# Patient Record
Sex: Female | Born: 1955 | Race: White | Hispanic: No | Smoking: Former smoker
Health system: Southern US, Community
[De-identification: ages and names within clinical notes are randomized; demographics above are authoritative.]

## PROBLEM LIST (undated history)

## (undated) DIAGNOSIS — I10 Essential (primary) hypertension: Secondary | ICD-10-CM

## (undated) DIAGNOSIS — I480 Paroxysmal atrial fibrillation: Secondary | ICD-10-CM

## (undated) DIAGNOSIS — I340 Nonrheumatic mitral (valve) insufficiency: Secondary | ICD-10-CM

## (undated) DIAGNOSIS — R002 Palpitations: Secondary | ICD-10-CM

## (undated) DIAGNOSIS — J019 Acute sinusitis, unspecified: Secondary | ICD-10-CM

## (undated) DIAGNOSIS — H269 Unspecified cataract: Secondary | ICD-10-CM

## (undated) DIAGNOSIS — J189 Pneumonia, unspecified organism: Secondary | ICD-10-CM

## (undated) DIAGNOSIS — Z7901 Long term (current) use of anticoagulants: Secondary | ICD-10-CM

## (undated) HISTORY — PX: TONSILLECTOMY: SUR1361

## (undated) HISTORY — DX: Paroxysmal atrial fibrillation: I48.0

## (undated) HISTORY — PX: TUBAL LIGATION: SHX77

## (undated) HISTORY — PX: HERNIA REPAIR: SHX51

## (undated) HISTORY — DX: Nonrheumatic mitral (valve) insufficiency: I34.0

## (undated) HISTORY — DX: Pneumonia, unspecified organism: J18.9

## (undated) HISTORY — PX: CRYOABLATION: SHX1415

## (undated) HISTORY — DX: Acute sinusitis, unspecified: J01.90

## (undated) HISTORY — DX: Long term (current) use of anticoagulants: Z79.01

## (undated) HISTORY — DX: Palpitations: R00.2

---

## 2008-09-01 DIAGNOSIS — H15009 Unspecified scleritis, unspecified eye: Secondary | ICD-10-CM | POA: Insufficient documentation

## 2012-03-08 DIAGNOSIS — I83893 Varicose veins of bilateral lower extremities with other complications: Secondary | ICD-10-CM | POA: Insufficient documentation

## 2014-06-29 DIAGNOSIS — M8000XD Age-related osteoporosis with current pathological fracture, unspecified site, subsequent encounter for fracture with routine healing: Secondary | ICD-10-CM | POA: Insufficient documentation

## 2016-08-21 DIAGNOSIS — H6123 Impacted cerumen, bilateral: Secondary | ICD-10-CM | POA: Insufficient documentation

## 2016-08-21 DIAGNOSIS — J329 Chronic sinusitis, unspecified: Secondary | ICD-10-CM | POA: Insufficient documentation

## 2018-06-07 ENCOUNTER — Encounter: Payer: Self-pay | Admitting: Cardiology

## 2018-06-10 ENCOUNTER — Encounter: Payer: Self-pay | Admitting: *Deleted

## 2018-06-10 ENCOUNTER — Ambulatory Visit: Payer: BLUE CROSS/BLUE SHIELD | Admitting: Cardiology

## 2018-06-10 ENCOUNTER — Encounter: Payer: Self-pay | Admitting: Cardiology

## 2018-06-10 VITALS — BP 124/72 | HR 81 | Ht 66.0 in | Wt 159.0 lb

## 2018-06-10 DIAGNOSIS — I480 Paroxysmal atrial fibrillation: Secondary | ICD-10-CM | POA: Diagnosis not present

## 2018-06-10 NOTE — Progress Notes (Signed)
Electrophysiology Office Note   Date:  06/10/2018   ID:  Deborah Ortiz, Deborah Ortiz 11/17/1955, MRN [id number]  PCP:  [name], MD  Cardiologist:   Primary Electrophysiologist:  Mahima Hottle Meredith Leeds, MD    No chief complaint on file.    History of Present Illness: Deborah Ortiz is a 62 y.o. female who is being seen today for the evaluation of atrial fibrillation at the request of Franco Nones, MD. Presenting today for electrophysiology evaluation.  She has a history of paroxysmal atrial fibrillation, mitral regurgitation.  In 08/2017, she started feeling poorly after having a nail infection.  January 2019 she was found to have atrial fibrillation and was started on Xarelto, sotalol, and had a TEE guided cardioversion.  In follow-up she had recurrent atrial fibrillation with inconsistent correlation of symptoms.  She underwent ablation and had PVI on 12/2017 with cryoablation.  Sotalol was stopped post procedure due to dizziness.    Today, she denies symptoms of chest pain, shortness of breath, orthopnea, PND, lower extremity edema, claudication, dizziness, presyncope, syncope, bleeding, or neurologic sequela. The patient is tolerating medications without difficulties.  He overall does feel well.  She unfortunately does continue to have occasional palpitations.  At this point, it does seem that her palpitations are due to atrial fibrillation.  She may want further therapy in the future with either medications or ablation.  She otherwise feels much improved with clear her mindset.  She is no longer having such a foggy feeling since her ablation.   Past Medical History:  Diagnosis Date  . Long term (current) use of anticoagulants   . Mitral valve regurgitation   . Palpitations   . Paroxysmal atrial fibrillation (HCC)   . Pneumonia    HISTORY  . Sinusitis, acute    Past Surgical History:  Procedure Laterality Date  . CRYOABLATION     heart  . HERNIA REPAIR    . TONSILLECTOMY     . TUBAL LIGATION       Current Outpatient Medications  Medication Sig Dispense Refill  . CALCIUM PO Take 1 tablet by mouth daily.    . Cholecalciferol (VITAMIN D-3) 1000 units CAPS Take 1 capsule by mouth daily.    Marland Kitchen diltiazem (CARDIZEM) 30 MG tablet TAKE 2 TABLETS BY MOUTH IN THE MORNING AND 1 TABLET BY MOUTH IN THE EVENING AS NEEDED.    Marland Kitchen Glucosamine HCl (GLUCOSAMINE PO) Take 1 capsule by mouth daily.    . Multiple Vitamin (MULTIVITAMIN) capsule Take 1 capsule by mouth daily.    . Omega-3 Fatty Acids (FISH OIL PO) Take 1 capsule by mouth daily.     . Probiotic Product (PROBIOTIC PO) Take 1 tablet by mouth daily.    . rivaroxaban (XARELTO) 20 MG TABS tablet Take 20 mg by mouth daily with supper.    . sotalol (BETAPACE) 80 MG tablet Take 40 mg by mouth 2 (two) times daily as needed.      No current facility-administered medications for this visit.     Allergies:   Patient has no known allergies.   Social History:  The patient  reports that she has never smoked. She has never used smokeless tobacco. She reports that she drinks alcohol. She reports that she does not use drugs.   Family History:  The patient's family history includes CVA in her mother; Cancer in her father and mother; Diabetes in her mother; Heart disease in her mother; Stroke in her mother.    ROS:  Please see the history of present illness.   Otherwise, review of systems is positive for palpitations, visual changes, easy bruising.   All other systems are reviewed and negative.    PHYSICAL EXAM: VS:  BP 124/72   Pulse 81   Ht 5\' 6"  (1.676 m)   Wt 159 lb (72.1 kg)   BMI 25.66 kg/m  , BMI Body mass index is 25.66 kg/m. GEN: Well nourished, well developed, in no acute distress  HEENT: normal  Neck: no JVD, carotid bruits, or masses Cardiac: RRR; no murmurs, rubs, or gallops,no edema  Respiratory:  clear to auscultation bilaterally, normal work of breathing GI: soft, nontender, nondistended, + BS MS: no  deformity or atrophy  Skin: warm and dry Neuro:  Strength and sensation are intact Psych: euthymic mood, full affect  EKG:  EKG is ordered today. Personal review of the ekg ordered shows sinus rhythm, rightward axis, rate 81  Recent Labs: No results found for requested labs within last 8760 hours.    Lipid Panel  No results found for: CHOL, TRIG, HDL, CHOLHDL, VLDL, LDLCALC, LDLDIRECT   Wt Readings from Last 3 Encounters:  06/10/18 159 lb (72.1 kg)      Other studies Reviewed: Additional studies/ records that were reviewed today include: TTE 07/05/2018 Review of the above records today demonstrates:  Ejection fraction 55 to 60% Normal right ventricular size and function Mild left atrial dilation No significant valvular heart disease  Cardiac CT: LAD eccentric calcified plaque less than 50% Circumflex no significant stenosis RCA motion artifact throughout precludes accurate assessment Left main no significant narrowing   ASSESSMENT AND PLAN:  1.  Physical atrial fibrillation: Currently on Xarelto.  Status post cryoablation 12/2017.  She currently has continued palpitations, but her symptoms of atrial fibrillation are much improved.  She may be interested in further therapy in the future, but at this point, we Maddoxx Burkitt hold off.  This patients CHA2DS2-VASc Score and unadjusted Ischemic Stroke Rate (% per year) is equal to 2.2 % stroke rate/year from a score of 2  Above score calculated as 1 point each if present [CHF, HTN, DM, Vascular=MI/PAD/Aortic Plaque, Age if 65-74, or Female] Above score calculated as 2 points each if present [Age > 75, or Stroke/TIA/TE]  Current medicines are reviewed at length with the patient today.   The patient does not have concerns regarding her medicines.  The following changes were made today:  none  Labs/ tests ordered today include:  Orders Placed This Encounter  Procedures  . EKG 12-Lead     Disposition:   FU with Vishaal Strollo 3  months  Signed, Armoni Kludt Meredith Leeds, MD  06/10/2018 3:16 PM     [facility] [address] [place] [address] [phone] (office) [phone] (fax)

## 2018-06-10 NOTE — Patient Instructions (Signed)
Medication Instructions:  Your physician recommends that you continue on your current medications as directed. Please refer to the Current Medication list given to you today.  * If you need a refill on your cardiac medications before your next appointment, please call your pharmacy.   Labwork: None ordered  Testing/Procedures: None ordered  Follow-Up: Your physician recommends that you schedule a follow-up appointment in: 3 months with Dr. Camnitz.  *Please note that any paperwork needing to be filled out by the provider will need to be addressed at the front desk prior to seeing the provider. Please note that any FMLA, disability or other documents regarding health condition is subject to a $25.00 charge that must be received prior to completion of paperwork in the form of a money order or check.  Thank you for choosing [facility]!!   Jeanita Carneiro, RN [phone]       

## 2018-06-10 NOTE — Addendum Note (Signed)
Addended by: Stanton Kidney on: 06/10/2018 03:48 PM   Modules accepted: Orders

## 2018-06-11 ENCOUNTER — Institutional Professional Consult (permissible substitution): Payer: Self-pay | Admitting: Internal Medicine

## 2018-09-13 ENCOUNTER — Ambulatory Visit: Payer: BLUE CROSS/BLUE SHIELD | Admitting: Cardiology

## 2018-10-15 ENCOUNTER — Ambulatory Visit: Payer: BLUE CROSS/BLUE SHIELD | Admitting: Cardiology

## 2018-10-15 ENCOUNTER — Encounter: Payer: Self-pay | Admitting: Cardiology

## 2018-10-15 VITALS — BP 110/64 | HR 91 | Ht 66.0 in | Wt 165.0 lb

## 2018-10-15 DIAGNOSIS — Z79899 Other long term (current) drug therapy: Secondary | ICD-10-CM

## 2018-10-15 DIAGNOSIS — I480 Paroxysmal atrial fibrillation: Secondary | ICD-10-CM | POA: Diagnosis not present

## 2018-10-15 LAB — BASIC METABOLIC PANEL
BUN/Creatinine Ratio: 18 (ref 12–28)
BUN: 15 mg/dL (ref 8–27)
CALCIUM: 10.5 mg/dL — AB (ref 8.7–10.3)
CO2: 23 mmol/L (ref 20–29)
Chloride: 102 mmol/L (ref 96–106)
Creatinine, Ser: 0.84 mg/dL (ref 0.57–1.00)
GFR calc Af Amer: 85 mL/min/{1.73_m2} (ref >59)
GFR calc non Af Amer: 74 mL/min/{1.73_m2} (ref >59)
GLUCOSE: 90 mg/dL (ref 65–99)
POTASSIUM: 4.6 mmol/L (ref 3.5–5.2)
Sodium: 140 mmol/L (ref 134–144)

## 2018-10-15 LAB — CBC
Hematocrit: 39.7 % (ref 34.0–46.6)
Hemoglobin: 13.6 g/dL (ref 11.1–15.9)
MCH: 31.1 pg (ref 26.6–33.0)
MCHC: 34.3 g/dL (ref 31.5–35.7)
MCV: 91 fL (ref 79–97)
PLATELETS: 221 10*3/uL (ref 150–450)
RBC: 4.37 x10E6/uL (ref 3.77–5.28)
RDW: 12.4 % (ref 11.7–15.4)
WBC: 6.8 10*3/uL (ref 3.4–10.8)

## 2018-10-15 NOTE — Patient Instructions (Signed)
Medication Instructions:  Your physician recommends that you continue on your current medications as directed. Please refer to the Current Medication list given to you today.  * If you need a refill on your cardiac medications before your next appointment, please call your pharmacy.   Labwork: Today: BMP & CBC *We will only notify you of abnormal results, otherwise continue current treatment plan.  Testing/Procedures: None ordered  Follow-Up: Your physician wants you to follow-up in: 6 months with Dr. Curt Bears.  You will receive a reminder letter in the mail two months in advance. If you don't receive a letter, please call our office to schedule the follow-up appointment.   Thank you for choosing [facility]!!   Trinidad Curet, RN [phone]

## 2018-10-15 NOTE — Progress Notes (Signed)
Electrophysiology Office Note   Date:  10/15/2018   ID:  Deborah Ortiz, [place] December 01, 1955, MRN [id number]  PCP:  [name], MD  Cardiologist:   Primary Electrophysiologist:  Lauren Aguayo Meredith Leeds, MD    No chief complaint on file.    History of Present Illness: Vicci Reder is a 63 y.o. female who is being seen today for the evaluation of atrial fibrillation at the request of [name], MD. Presenting today for electrophysiology evaluation.  She has a history of paroxysmal atrial fibrillation, mitral regurgitation.  In 08/2017, she started feeling poorly after having a nail infection.  January 2019 she was found to have atrial fibrillation and was started on Xarelto, sotalol, and had a TEE guided cardioversion.  In follow-up she had recurrent atrial fibrillation with inconsistent correlation of symptoms.  She underwent ablation and had PVI on 12/2017 with cryoablation.  Sotalol was stopped post procedure due to dizziness.  Today, denies symptoms of palpitations, chest pain, shortness of breath, orthopnea, PND, lower extremity edema, claudication, dizziness, presyncope, syncope, bleeding, or neurologic sequela. The patient is tolerating medications without difficulties.  She is currently feeling well.  She has not had any prolonged episodes of atrial fibrillation since last being seen.  She is tolerating her sotalol without issue.   Past Medical History:  Diagnosis Date  . Long term (current) use of anticoagulants   . Mitral valve regurgitation   . Palpitations   . Paroxysmal atrial fibrillation (HCC)   . Pneumonia    HISTORY  . Sinusitis, acute    Past Surgical History:  Procedure Laterality Date  . CRYOABLATION     heart  . HERNIA REPAIR    . TONSILLECTOMY    . TUBAL LIGATION       Current Outpatient Medications  Medication Sig Dispense Refill  . BIOTIN PO Take by mouth daily.    Marland Kitchen CALCIUM PO Take 1 tablet by mouth daily.    . Cholecalciferol (VITAMIN  D-3) 1000 units CAPS Take 1 capsule by mouth daily.    . COLLAGEN PO Take by mouth daily.    . Glucosamine HCl (GLUCOSAMINE PO) Take 1 capsule by mouth daily.    . Multiple Vitamin (MULTIVITAMIN) capsule Take 1 capsule by mouth daily.    . Omega-3 Fatty Acids (FISH OIL PO) Take 1 capsule by mouth daily.     . Probiotic Product (PROBIOTIC PO) Take 1 tablet by mouth daily.    . rivaroxaban (XARELTO) 20 MG TABS tablet Take 20 mg by mouth daily with supper.     No current facility-administered medications for this visit.     Allergies:   Patient has no known allergies.   Social History:  The patient  reports that she has never smoked. She has never used smokeless tobacco. She reports current alcohol use. She reports that she does not use drugs.   Family History:  The patient's family history includes CVA in her mother; Cancer in her father and mother; Diabetes in her mother; Heart disease in her mother; Stroke in her mother.    ROS:  Please see the history of present illness.   Otherwise, review of systems is positive for none.   All other systems are reviewed and negative.   PHYSICAL EXAM: VS:  BP 110/64   Pulse 91   Ht 5\' 6"  (1.676 m)   Wt 165 lb (74.8 kg)   SpO2 97%   BMI 26.63 kg/m  , BMI Body mass index is 26.63  kg/m. GEN: Well nourished, well developed, in no acute distress  HEENT: normal  Neck: no JVD, carotid bruits, or masses Cardiac: RRR; no murmurs, rubs, or gallops,no edema  Respiratory:  clear to auscultation bilaterally, normal work of breathing GI: soft, nontender, nondistended, + BS MS: no deformity or atrophy  Skin: warm and dry Neuro:  Strength and sensation are intact Psych: euthymic mood, full affect  EKG:  EKG is not ordered today. Personal review of the ekg ordered 06/10/18 shows SR, RAD, rate 81   Recent Labs: No results found for requested labs within last 8760 hours.    Lipid Panel  No results found for: CHOL, TRIG, HDL, CHOLHDL, VLDL, LDLCALC,  LDLDIRECT   Wt Readings from Last 3 Encounters:  10/15/18 165 lb (74.8 kg)  06/10/18 159 lb (72.1 kg)      Other studies Reviewed: Additional studies/ records that were reviewed today include: TTE 07/05/2018 Review of the above records today demonstrates:  Ejection fraction 55 to 60% Normal right ventricular size and function Mild left atrial dilation No significant valvular heart disease  Cardiac CT: LAD eccentric calcified plaque less than 50% Circumflex no significant stenosis RCA motion artifact throughout precludes accurate assessment Left main no significant narrowing   ASSESSMENT AND PLAN:  1.  Paroxysmal atrial fibrillation: Currently on Xarelto and sotalol.  Status post cryoablation 12/2017.  She did continue to have palpitations but she has felt much improved over the last few months.  She continues to have short bursts, but is pleased with her therapy.  No changes.    This patients CHA2DS2-VASc Score and unadjusted Ischemic Stroke Rate (% per year) is equal to 2.2 % stroke rate/year from a score of 2  Above score calculated as 1 point each if present [CHF, HTN, DM, Vascular=MI/PAD/Aortic Plaque, Age if 65-74, or Female] Above score calculated as 2 points each if present [Age > 75, or Stroke/TIA/TE]   Current medicines are reviewed at length with the patient today.   The patient does not have concerns regarding her medicines.  The following changes were made today: None  Labs/ tests ordered today include:  Orders Placed This Encounter  Procedures  . Basic metabolic panel  . CBC     Disposition:   FU with Caidon Foti 6 months  Signed, Esiah Bazinet Meredith Leeds, MD  10/15/2018 12:24 PM     [facility] [address] [place] [address] [phone] (office) [phone] (fax)

## 2018-10-21 ENCOUNTER — Other Ambulatory Visit: Payer: Self-pay | Admitting: Internal Medicine

## 2018-10-21 DIAGNOSIS — Z1231 Encounter for screening mammogram for malignant neoplasm of breast: Secondary | ICD-10-CM

## 2018-11-29 ENCOUNTER — Telehealth: Payer: Self-pay | Admitting: Cardiology

## 2018-11-29 NOTE — Telephone Encounter (Signed)
Pt reports that she is "cleaning out her cabinets" and found some "immodium and pepto bismol".  She is asking if she can take either.  States she heard that she should avoid imodium. Informed pt to avoid imodium but she could take pepto if needed. Pt agreeable to plan and thanks me for calling back.

## 2018-11-29 NOTE — Telephone Encounter (Signed)
New Message:     Pt says she has a question about over the counter medicine please.

## 2018-12-25 ENCOUNTER — Other Ambulatory Visit: Payer: Self-pay

## 2018-12-25 MED ORDER — RIVAROXABAN 20 MG PO TABS: 20.0000 mg | ORAL_TABLET | Freq: Every day | ORAL | 5 refills | Status: DC

## 2018-12-25 NOTE — Telephone Encounter (Signed)
Age 63, weight 74.8kg, SCr 0.84 10/15/18. CrCl 14mL/min. Last OV January 2020, anticoag indication - afib.

## 2019-03-14 ENCOUNTER — Telehealth: Payer: Self-pay | Admitting: Cardiology

## 2019-03-14 NOTE — Telephone Encounter (Signed)
New Message    Pt is calling wondering if Deborah Ortiz can call her and give her recommendations on blood pressure cuffs    Please call

## 2019-03-17 NOTE — Telephone Encounter (Signed)
Pt aware I will discuss recommendation w/ HTN clinic and call her back this week. Pt agreeable and voices this is not urgent.

## 2019-03-18 NOTE — Telephone Encounter (Signed)
Reviewed w/ HTN clinic/pharmD.  Advised pt recommend Omron BP cuff, not a wrist one. Patient verbalized understanding and agreeable to plan. She appreciates me looking into this for her.

## 2019-03-20 ENCOUNTER — Ambulatory Visit
Admission: RE | Admit: 2019-03-20 | Discharge: 2019-03-20 | Disposition: A | Discharge status: Home / Self Care | Payer: Medicare Other | Source: Ambulatory Visit | Attending: Internal Medicine | Admitting: Internal Medicine

## 2019-03-20 ENCOUNTER — Other Ambulatory Visit: Payer: Self-pay

## 2019-03-20 DIAGNOSIS — Z1231 Encounter for screening mammogram for malignant neoplasm of breast: Secondary | ICD-10-CM

## 2019-04-09 ENCOUNTER — Telehealth: Payer: Self-pay | Admitting: Cardiology

## 2019-04-09 NOTE — Telephone Encounter (Signed)
New message    Spoke w/ pt and RS appt from 07.17.20 to 07.13.20. Pt will do video visit with Dr. Curt Bears, phone number is listed in appt notes.       Virtual Visit Pre-Appointment Phone Call  "(Name), I am calling you today to discuss your upcoming appointment. We are currently trying to limit exposure to the virus that causes COVID-19 by seeing patients at home rather than in the office."  1. "What is the BEST phone number to call the day of the visit?" - include this in appointment notes  2. Do you have or have access to (through a family member/friend) a smartphone with video capability that we can use for your visit?" a. If yes - list this number in appt notes as cell (if different from BEST phone #) and list the appointment type as a VIDEO visit in appointment notes b. If no - list the appointment type as a PHONE visit in appointment notes  3. Confirm consent - "In the setting of the current Covid19 crisis, you are scheduled for a (phone or video) visit with your provider on (date) at (time).  Just as we do with many in-office visits, in order for you to participate in this visit, we must obtain consent.  If you'd like, I can send this to your mychart (if signed up) or email for you to review.  Otherwise, I can obtain your verbal consent now.  All virtual visits are billed to your insurance company just like a normal visit would be.  By agreeing to a virtual visit, we'd like you to understand that the technology does not allow for your provider to perform an examination, and thus may limit your provider's ability to fully assess your condition. If your provider identifies any concerns that need to be evaluated in person, we will make arrangements to do so.  Finally, though the technology is pretty good, we cannot assure that it will always work on either your or our end, and in the setting of a video visit, we may have to convert it to a phone-only visit.  In either situation, we cannot  ensure that we have a secure connection.  Are you willing to proceed?" STAFF: Did the patient verbally acknowledge consent to telehealth visit? Document YES/NO here: YES  4. Advise patient to be prepared - "Two hours prior to your appointment, go ahead and check your blood pressure, pulse, oxygen saturation, and your weight (if you have the equipment to check those) and write them all down. When your visit starts, your provider will ask you for this information. If you have an Apple Watch or Kardia device, please plan to have heart rate information ready on the day of your appointment. Please have a pen and paper handy nearby the day of the visit as well."  5. Give patient instructions for MyChart download to smartphone OR [email or web address] as below if video visit (depending on what platform provider is using)  6. Inform patient they will receive a phone call 15 minutes prior to their appointment time (may be from unknown caller ID) so they should be prepared to answer    TELEPHONE CALL NOTE  Tasia Liz Limb has been deemed a candidate for a follow-up tele-health visit to limit community exposure during the Covid-19 pandemic. I spoke with the patient via phone to ensure availability of phone/video source, confirm preferred email & phone number, and discuss instructions and expectations.  I reminded Deborah Ortiz to be prepared  with any vital sign and/or heart rhythm information that could potentially be obtained via home monitoring, at the time of her visit. I reminded Krista Godsil Mcbrayer to expect a phone call prior to her visit.  [place] Harriette Ohara 04/09/2019 9:45 AM   INSTRUCTIONS FOR DOWNLOADING THE MYCHART APP TO SMARTPHONE  - The patient must first make sure to have activated MyChart and know their login information - If Apple, go to [employer] and type in MyChart in the search bar and download the app. If Android, ask patient to go to [employer] and type in [place] in the  search bar and download the app. The app is free but as with any other app downloads, their phone may require them to verify saved payment information or Apple/Android password.  - The patient will need to then log into the app with their MyChart username and password, and select [facility] as their healthcare provider to link the account. When it is time for your visit, go to the MyChart app, find appointments, and click Begin Video Visit. Be sure to Select Allow for your device to access the Microphone and Camera for your visit. You will then be connected, and your provider will be with you shortly.  **If they have any issues connecting, or need assistance please contact MyChart service desk [phone])**  **If using a computer, in order to ensure the best quality for their visit they will need to use either of the following Internet Browsers: [employer], or Google Chrome**  IF USING DOXIMITY or DOXY.ME - The patient will receive a link just prior to their visit by text.     FULL LENGTH CONSENT FOR TELE-HEALTH VISIT   I hereby voluntarily request, consent and authorize [facility] and its employed or contracted physicians, physician assistants, nurse practitioners or other licensed health care professionals (the Practitioner), to provide me with telemedicine health care services (the Services") as deemed necessary by the treating Practitioner. I acknowledge and consent to receive the Services by the Practitioner via telemedicine. I understand that the telemedicine visit will involve communicating with the Practitioner through live audiovisual communication technology and the disclosure of certain medical information by electronic transmission. I acknowledge that I have been given the opportunity to request an in-person assessment or other available alternative prior to the telemedicine visit and am voluntarily participating in the telemedicine visit.  I understand that I  have the right to withhold or withdraw my consent to the use of telemedicine in the course of my care at any time, without affecting my right to future care or treatment, and that the Practitioner or I may terminate the telemedicine visit at any time. I understand that I have the right to inspect all information obtained and/or recorded in the course of the telemedicine visit and may receive copies of available information for a reasonable fee.  I understand that some of the potential risks of receiving the Services via telemedicine include:   Delay or interruption in medical evaluation due to technological equipment failure or disruption;  Information transmitted may not be sufficient (e.g. poor resolution of images) to allow for appropriate medical decision making by the Practitioner; and/or   In rare instances, security protocols could fail, causing a breach of personal health information.  Furthermore, I acknowledge that it is my responsibility to provide information about my medical history, conditions and care that is complete and accurate to the best of my ability. I acknowledge that Practitioner's advice, recommendations,  and/or decision may be based on factors not within their control, such as incomplete or inaccurate data provided by me or distortions of diagnostic images or specimens that may result from electronic transmissions. I understand that the practice of medicine is not an exact science and that Practitioner makes no warranties or guarantees regarding treatment outcomes. I acknowledge that I will receive a copy of this consent concurrently upon execution via email to the email address I last provided but may also request a printed copy by calling the office of [facility].    I understand that my insurance will be billed for this visit.   I have read or had this consent read to me.  I understand the contents of this consent, which adequately explains the benefits and risks of the  Services being provided via telemedicine.   I have been provided ample opportunity to ask questions regarding this consent and the Services and have had my questions answered to my satisfaction.  I give my informed consent for the services to be provided through the use of telemedicine in my medical care  By participating in this telemedicine visit I agree to the above.

## 2019-04-14 ENCOUNTER — Telehealth (INDEPENDENT_AMBULATORY_CARE_PROVIDER_SITE_OTHER): Payer: Medicare Other | Admitting: Cardiology

## 2019-04-14 DIAGNOSIS — I480 Paroxysmal atrial fibrillation: Secondary | ICD-10-CM

## 2019-04-14 NOTE — Progress Notes (Signed)
Electrophysiology TeleHealth Note   Due to national recommendations of social distancing due to COVID 19, an audio/video telehealth visit is felt to be most appropriate for this patient at this time.  See Epic message for the patient's consent to telehealth for [facility].   Date:  04/14/2019   ID:  Sheralyn, Pinegar 1956-03-31, MRN [id number]  Location: patient's home  Provider location: [address] [place]  Evaluation Performed: Follow-up visit  PCP:  [name], MD  Cardiologist:  No primary care provider on file.  Electrophysiologist:  Dr Curt Bears  Chief Complaint:  AF  History of Present Illness:    Deborah Ortiz is a 63 y.o. female who presents via audio/video conferencing for a telehealth visit today.  Since last being seen in our clinic, the patient reports doing very well.  Today, she denies symptoms of palpitations, chest pain, shortness of breath,  lower extremity edema, dizziness, presyncope, or syncope.  The patient is otherwise without complaint today.  The patient denies symptoms of fevers, chills, cough, or new SOB worrisome for COVID 19.  History of paroxysmal AF, mitral regurgitation. S/p PVI with cryoablation 12/2017.  Today, denies symptoms of palpitations, chest pain, shortness of breath, orthopnea, PND, lower extremity edema, claudication, dizziness, presyncope, syncope, bleeding, or neurologic sequela. The patient is tolerating medications without difficulties.  She continues to have intermittent palpitations.  Her palpitations have not changed since last being seen.  She feels a slight pressure in the center of her chest when this occurs.  She also has some weakness and fatigue.  She has been self isolating since the coronavirus pandemic, trying to avoid people and has not gone outside very much.  Past Medical History:  Diagnosis Date  . Long term (current) use of anticoagulants   . Mitral valve regurgitation   .  Palpitations   . Paroxysmal atrial fibrillation (HCC)   . Pneumonia    HISTORY  . Sinusitis, acute     Past Surgical History:  Procedure Laterality Date  . CRYOABLATION     heart  . HERNIA REPAIR    . TONSILLECTOMY    . TUBAL LIGATION      Current Outpatient Medications  Medication Sig Dispense Refill  . BIOTIN PO Take by mouth daily.    Marland Kitchen CALCIUM PO Take 1 tablet by mouth daily.    . Cholecalciferol (VITAMIN D-3) 1000 units CAPS Take 1 capsule by mouth daily.    . COLLAGEN PO Take by mouth daily.    . Glucosamine HCl (GLUCOSAMINE PO) Take 1 capsule by mouth daily.    . Multiple Vitamin (MULTIVITAMIN) capsule Take 1 capsule by mouth daily.    . Omega-3 Fatty Acids (FISH OIL PO) Take 1 capsule by mouth daily.     . Probiotic Product (PROBIOTIC PO) Take 1 tablet by mouth daily.    . rivaroxaban (XARELTO) 20 MG TABS tablet Take 1 tablet (20 mg total) by mouth daily with supper. 30 tablet 5   No current facility-administered medications for this visit.     Allergies:   Patient has no known allergies.   Social History:  The patient  reports that she has never smoked. She has never used smokeless tobacco. She reports current alcohol use. She reports that she does not use drugs.   Family History:  The patient's  family history includes CVA in her mother; Cancer in her father and mother; Diabetes in her mother; Heart disease in her mother;  Stroke in her mother.   ROS:  Please see the history of present illness.   All other systems are personally reviewed and negative.    Exam:    Vital Signs:  Pulse 77   Wt 148 lb (67.1 kg)   SpO2 98%   BMI 23.89 kg/m   no acute distress, no shortness of breath.  Labs/Other Tests and Data Reviewed:    Recent Labs: 10/15/2018: BUN 15; Creatinine, Ser 0.84; Hemoglobin 13.6; Platelets 221; Potassium 4.6; Sodium 140   Wt Readings from Last 3 Encounters:  04/14/19 148 lb (67.1 kg)  10/15/18 165 lb (74.8 kg)  06/10/18 159 lb (72.1 kg)      Other studies personally reviewed: Additional studies/ records that were reviewed today include: ECG 06/10/18 personally reviewed  Review of the above records today demonstrates:  SR, rate 81   ASSESSMENT & PLAN:    1.  Paroxysmal atrial fibrillation: Currently on Xarelto.  Status post cryoablation 01/19/2018.  She unfortunately has continued to have short episodes of atrial fibrillation, but she does not feel that any further intervention is needed at this time.  No changes.  This patients CHA2DS2-VASc Score and unadjusted Ischemic Stroke Rate (% per year) is equal to 2.2 % stroke rate/year from a score of 2  Above score calculated as 1 point each if present [CHF, HTN, DM, Vascular=MI/PAD/Aortic Plaque, Age if 65-74, or Female] Above score calculated as 2 points each if present [Age > 75, or Stroke/TIA/TE]  COVID 19 screen The patient denies symptoms of COVID 19 at this time.  The importance of social distancing was discussed today.  Follow-up: 6 months  Current medicines are reviewed at length with the patient today.   The patient does not have concerns regarding her medicines.  The following changes were made today:  none  Labs/ tests ordered today include:  No orders of the defined types were placed in this encounter.    Patient Risk:  after full review of this patients clinical status, I feel that they are at moderate risk at this time.  Today, I have spent 15 minutes with the patient with telehealth technology discussing atrial fibrillation, coronavirus.    Signed, Gavriella Hearst Meredith Leeds, MD  04/14/2019 3:31 PM     [place] [address] [place] [address] [phone] (office) [phone] (fax)

## 2019-04-18 ENCOUNTER — Telehealth: Payer: BLUE CROSS/BLUE SHIELD | Admitting: Cardiology

## 2019-04-29 ENCOUNTER — Other Ambulatory Visit (HOSPITAL_COMMUNITY)
Admission: RE | Admit: 2019-04-29 | Discharge: 2019-04-29 | Disposition: A | Discharge status: Home / Self Care | Payer: Medicare Other | Source: Ambulatory Visit | Attending: Obstetrics and Gynecology | Admitting: Obstetrics and Gynecology

## 2019-04-29 ENCOUNTER — Other Ambulatory Visit: Payer: Self-pay | Admitting: Obstetrics and Gynecology

## 2019-04-29 DIAGNOSIS — Z01419 Encounter for gynecological examination (general) (routine) without abnormal findings: Secondary | ICD-10-CM | POA: Diagnosis present

## 2019-05-13 LAB — CYTOLOGY - PAP
Diagnosis: NEGATIVE
HPV: NOT DETECTED

## 2019-05-23 DIAGNOSIS — H9203 Otalgia, bilateral: Secondary | ICD-10-CM | POA: Insufficient documentation

## 2019-05-23 DIAGNOSIS — M2669 Other specified disorders of temporomandibular joint: Secondary | ICD-10-CM | POA: Insufficient documentation

## 2019-07-19 ENCOUNTER — Other Ambulatory Visit: Payer: Self-pay | Admitting: Cardiology

## 2019-07-21 NOTE — Telephone Encounter (Signed)
Xarelto 20mg  refill request received. Pt is 63 years old, weight-67.1kg, Crea-0.84 on 10/15/2018, last seen by Dr. Curt Bears on 04/14/2019 via Telemedicine, Diagnosis-Afib, CrCl-70.70ml/min; Dose is appropriate based on dosing criteria. Will send in refill to requested pharmacy.

## 2019-07-24 ENCOUNTER — Telehealth: Payer: Self-pay | Admitting: Cardiology

## 2019-07-24 NOTE — Telephone Encounter (Signed)
Advised pt that she may take Claritin while taking Xarelto. Patient verbalized understanding and agreeable to plan.

## 2019-07-24 NOTE — Telephone Encounter (Signed)
New Message:      Pt wants to know if she can take Claritin with her other medicine she is on?

## 2019-10-13 ENCOUNTER — Other Ambulatory Visit: Payer: Self-pay

## 2019-10-13 ENCOUNTER — Ambulatory Visit (INDEPENDENT_AMBULATORY_CARE_PROVIDER_SITE_OTHER): Payer: Medicare Other | Admitting: Cardiology

## 2019-10-13 ENCOUNTER — Encounter: Payer: Self-pay | Admitting: Cardiology

## 2019-10-13 VITALS — BP 118/68 | HR 71 | Ht 66.0 in | Wt 157.4 lb

## 2019-10-13 DIAGNOSIS — I480 Paroxysmal atrial fibrillation: Secondary | ICD-10-CM | POA: Diagnosis not present

## 2019-10-13 NOTE — Patient Instructions (Signed)
Medication Instructions:  Your physician recommends that you continue on your current medications as directed. Please refer to the Current Medication list given to you today.  * If you need a refill on your cardiac medications before your next appointment, please call your pharmacy.   Labwork: None ordered If you have labs (blood work) drawn today and your tests are completely normal, you will receive your results only by:  [facility] (if you have MyChart) OR  A paper copy in the mail If you have any lab test that is abnormal or we need to change your treatment, we will call you to review the results.  Testing/Procedures: None ordered  Follow-Up: At [facility], you and your health needs are our priority.  As part of our continuing mission to provide you with exceptional heart care, we have created designated Provider Care Teams.  These Care Teams include your primary Cardiologist (physician) and Advanced Practice Providers (APPs -  Physician Assistants and Nurse Practitioners) who all work together to provide you with the care you need, when you need it.  You will need a follow up appointment in 6 months.  Please call our office 2 months in advance to schedule this appointment.  You may see Dr Curt Bears or one of the following Advanced Practice Providers on your designated Care Team:    Chanetta Marshall, NP  Tommye Standard, PA-C  Oda Kilts, [place]   Thank you for choosing [facility]!!   Trinidad Curet, RN [phone]  Any Other Special Instructions Will Be Listed Below (If Applicable).

## 2019-10-13 NOTE — Progress Notes (Signed)
Electrophysiology Office Note   Date:  10/13/2019   ID:  Cailey, Stauffer 1956/08/10, MRN [id number]  PCP:  [name], MD  Cardiologist:   Primary Electrophysiologist:  Joziyah Roblero Meredith Leeds, MD    No chief complaint on file.    History of Present Illness: Deborah Ortiz is a 64 y.o. female who is being seen today for the evaluation of atrial fibrillation at the request of [name], MD. Presenting today for electrophysiology evaluation.  She has a history of paroxysmal atrial fibrillation, mitral regurgitation.  In 08/2017, she started feeling poorly after having a nail infection.  January 2019 she was found to have atrial fibrillation and was started on Xarelto, sotalol, and had a TEE guided cardioversion.  In follow-up she had recurrent atrial fibrillation with inconsistent correlation of symptoms.  She underwent ablation and had PVI on 12/2017 with cryoablation.  Sotalol was stopped post procedure due to dizziness.   Today, denies symptoms of palpitations, chest pain, shortness of breath, orthopnea, PND, lower extremity edema, claudication, dizziness, presyncope, syncope, bleeding, or neurologic sequela. The patient is tolerating medications without difficulties.  She has noted no further episodes of atrial fibrillation.  She has stayed in normal rhythm.  She does feel some thumping.  This occurs mainly when she is stressed.  Nothing makes her mildly short of breath as well.  She says that when she is less stressed, this greatly improves her symptoms.  Past Medical History:  Diagnosis Date  . Long term (current) use of anticoagulants   . Mitral valve regurgitation   . Palpitations   . Paroxysmal atrial fibrillation (HCC)   . Pneumonia    HISTORY  . Sinusitis, acute    Past Surgical History:  Procedure Laterality Date  . CRYOABLATION     heart  . HERNIA REPAIR    . TONSILLECTOMY    . TUBAL LIGATION       Current Outpatient Medications  Medication Sig  Dispense Refill  . BIOTIN PO Take by mouth daily.    Marland Kitchen CALCIUM PO Take 1 tablet by mouth daily.    . Cholecalciferol (VITAMIN D-3) 1000 units CAPS Take 1 capsule by mouth daily.    . COLLAGEN PO Take by mouth daily.    . Glucosamine HCl (GLUCOSAMINE PO) Take 1 capsule by mouth daily.    . Multiple Vitamin (MULTIVITAMIN) capsule Take 1 capsule by mouth daily.    . Omega-3 Fatty Acids (FISH OIL PO) Take 1 capsule by mouth daily.     . Probiotic Product (PROBIOTIC PO) Take 1 tablet by mouth daily.    Alveda Reasons 20 MG TABS tablet TAKE 1 TABLET (20 MG TOTAL) BY MOUTH DAILY WITH SUPPER. 30 tablet 5   No current facility-administered medications for this visit.    Allergies:   Doxycycline and Penicillins   Social History:  The patient  reports that she quit smoking about 11 years ago. She has a 30.00 pack-year smoking history. She has never used smokeless tobacco. She reports current alcohol use. She reports that she does not use drugs.   Family History:  The patient's family history includes CVA in her mother; Cancer in her father and mother; Diabetes in her mother; Heart disease in her mother; Stroke in her mother.    ROS:  Please see the history of present illness.   Otherwise, review of systems is positive for none.   All other systems are reviewed and negative.   PHYSICAL EXAM: VS:  BP  118/68   Pulse 71   Ht 5\' 6"  (1.676 m)   Wt 157 lb 6.4 oz (71.4 kg)   SpO2 97%   BMI 25.41 kg/m  , BMI Body mass index is 25.41 kg/m. GEN: Well nourished, well developed, in no acute distress  HEENT: normal  Neck: no JVD, carotid bruits, or masses Cardiac: RRR; no murmurs, rubs, or gallops,no edema  Respiratory:  clear to auscultation bilaterally, normal work of breathing GI: soft, nontender, nondistended, + BS MS: no deformity or atrophy  Skin: warm and dry Neuro:  Strength and sensation are intact Psych: euthymic mood, full affect  EKG:  EKG is ordered today. Personal review of the ekg  ordered shows this rhythm, rate 71, PVCs  Recent Labs: 10/15/2018: BUN 15; Creatinine, Ser 0.84; Hemoglobin 13.6; Platelets 221; Potassium 4.6; Sodium 140    Lipid Panel  No results found for: CHOL, TRIG, HDL, CHOLHDL, VLDL, LDLCALC, LDLDIRECT   Wt Readings from Last 3 Encounters:  10/13/19 157 lb 6.4 oz (71.4 kg)  04/14/19 148 lb (67.1 kg)  10/15/18 165 lb (74.8 kg)      Other studies Reviewed: Additional studies/ records that were reviewed today include: TTE 07/05/2018 Review of the above records today demonstrates:  Ejection fraction 55 to 60% Normal right ventricular size and function Mild left atrial dilation No significant valvular heart disease  Cardiac CT: LAD eccentric calcified plaque less than 50% Circumflex no significant stenosis RCA motion artifact throughout precludes accurate assessment Left main no significant narrowing   ASSESSMENT AND PLAN:  1.  Paroxysmal atrial fibrillation: Currently on Xarelto.  Status post cryoablation April 2019.  CHA2DS2-VASc of 2.  Remains in sinus rhythm.  No changes.  2.  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  She is having symptoms of palpitations at times.  She does not feel that her palpitations are all that often and are likely due to stress.  She [place] Bury try to adjust her lifestyle to see if this helps.  We Delmar Dondero order a cardiac monitor if this does not make any difference.   Current medicines are reviewed at length with the patient today.   The patient does not have concerns regarding her medicines.  The following changes were made today: None  Labs/ tests ordered today include:  Orders Placed This Encounter  Procedures  . EKG 12-Lead     Disposition:   FU with Jood Retana 6 months  Signed, Tacara Hadlock Meredith Leeds, MD  10/13/2019 11:53 AM     [facility] [address] [place] [address] [phone] (office) [phone] (fax)

## 2019-10-28 ENCOUNTER — Other Ambulatory Visit: Payer: Self-pay | Admitting: Internal Medicine

## 2019-10-28 DIAGNOSIS — Z1231 Encounter for screening mammogram for malignant neoplasm of breast: Secondary | ICD-10-CM

## 2019-10-28 DIAGNOSIS — M8580 Other specified disorders of bone density and structure, unspecified site: Secondary | ICD-10-CM

## 2019-11-10 ENCOUNTER — Telehealth: Payer: Self-pay | Admitting: Cardiology

## 2019-11-10 NOTE — Telephone Encounter (Signed)
Discussed monitor questions  Psych note from OV clarified.  She was asking about his notation of euthymic mood & full affect.  She appreciates the follow up call.

## 2019-11-10 NOTE — Telephone Encounter (Signed)
New message    Patient has questions about usage of heart monitor. Please call to discuss.

## 2019-11-27 ENCOUNTER — Other Ambulatory Visit: Payer: Self-pay | Admitting: Internal Medicine

## 2019-12-03 ENCOUNTER — Other Ambulatory Visit: Payer: Self-pay | Admitting: Internal Medicine

## 2019-12-03 DIAGNOSIS — F17201 Nicotine dependence, unspecified, in remission: Secondary | ICD-10-CM

## 2019-12-15 ENCOUNTER — Other Ambulatory Visit: Payer: Self-pay

## 2019-12-15 ENCOUNTER — Ambulatory Visit
Admission: RE | Admit: 2019-12-15 | Discharge: 2019-12-15 | Disposition: A | Discharge status: Home / Self Care | Payer: Medicare Other | Source: Ambulatory Visit | Attending: Internal Medicine | Admitting: Internal Medicine

## 2019-12-15 ENCOUNTER — Telehealth: Payer: Self-pay | Admitting: Cardiology

## 2019-12-15 ENCOUNTER — Telehealth: Payer: Self-pay | Admitting: *Deleted

## 2019-12-15 DIAGNOSIS — F17201 Nicotine dependence, unspecified, in remission: Secondary | ICD-10-CM

## 2019-12-15 NOTE — Telephone Encounter (Signed)
Called and spoke to patient. She states that she had a chest CT for Lung cancer screening today. She states that she would like for Dr. Curt Bears to take a look as it stated that she had "LAD coronary artery calcification" and Aortic Atherosclerosis". She would like for Dr. Curt Bears to review and his RN call with any recommendations.

## 2019-12-15 NOTE — Telephone Encounter (Signed)
New message:    Patient calling and would like for some one to call back concerning some results. Please cal patient.

## 2019-12-15 NOTE — Telephone Encounter (Addendum)
Patient with diagnosis of atrial fibrillation on Xarelto for anticoagulation.    Procedure: colonoscopy Date of procedure: 12/26/2019  CHADS2-VASc score of 2 (AGE, female)  CrCl >60 Platelet count 221  Per office protocol, patient can hold Xarelto for 1-2 days prior to procedure.    Reviewed by  Ramond Dial, Pharm.D, BCPS, CPP [place]  [id number] [place], [place] [address]  Phone: [phone]; Fax: [phone]

## 2019-12-15 NOTE — Telephone Encounter (Signed)
   Primary Cardiologist: Dr. Curt Bears  Chart reviewed as part of pre-operative protocol coverage. Given past medical history and time since last visit, based on ACC/AHA guidelines, Deborah Ortiz would be at acceptable risk for the planned procedure without further cardiovascular testing.   I will route this recommendation to the requesting party via Epic fax function and remove from pre-op pool.  Please call with questions.  [place], [place] 12/15/2019, 2:08 PM

## 2019-12-15 NOTE — Telephone Encounter (Signed)
   [facility] Pre-operative Risk Assessment    Request for surgical clearance:  1. What type of surgery is being performed? COLONOSCOPY   2. When is this surgery scheduled? 12/26/19   3. What type of clearance is required (medical clearance vs. Pharmacy clearance to hold med vs. Both)? BOTH  4. Are there any medications that need to be held prior to surgery and how long? [place]   5. Practice name and name of physician performing surgery? [facility]; DR. Therisa Doyne   6. What is your office phone number [phone]    7.   What is your office fax number [phone]  8.   Anesthesia type (None, local, MAC, general) ? PROPOFOL   Julaine Hua 12/15/2019, 10:34 AM  _________________________________________________________________   (provider comments below)

## 2019-12-17 NOTE — Telephone Encounter (Signed)
Amy from calling [facility] stating Dr. Therisa Doyne is requesting the patient hold Xarelto for 3 days prior instead of 1-2.

## 2019-12-17 NOTE — Telephone Encounter (Signed)
3 day hold should not be required for lower risk procedure like a colonoscopy, especially since her renal function is normal (CrCl 68mL/min) and she'll be clearing Xarelto in a day. If 3 day hold is absolutely necessary from GI perspective, would be ok from cardiac risk standpoint.

## 2019-12-23 ENCOUNTER — Telehealth: Payer: Self-pay | Admitting: Cardiology

## 2019-12-23 NOTE — Telephone Encounter (Signed)
Returned call to patient. Informed her of our recommendation. Patient verbalized understanding and agreeable to plan.

## 2019-12-23 NOTE — Telephone Encounter (Signed)
Patient wants to hear from [facility] price directly that she is able to go off her Xarelto for 3 day for surgery.

## 2020-01-16 ENCOUNTER — Other Ambulatory Visit: Payer: Self-pay | Admitting: Cardiology

## 2020-01-16 NOTE — Telephone Encounter (Signed)
Prescription refill request for Xarelto received.   Last office visit: [place], 10/13/2019 Weight: 71.4 kg  Age: 64 y.o. Scr:  0.8 10/20/2019 via [facility] CrCl: 79.02 ml/min  Prescription refill sent.

## 2020-02-05 ENCOUNTER — Ambulatory Visit: Payer: Medicare Other | Admitting: Podiatry

## 2020-02-16 ENCOUNTER — Ambulatory Visit (INDEPENDENT_AMBULATORY_CARE_PROVIDER_SITE_OTHER): Payer: Medicare Other

## 2020-02-16 ENCOUNTER — Encounter: Payer: Self-pay | Admitting: Podiatry

## 2020-02-16 ENCOUNTER — Other Ambulatory Visit: Payer: Self-pay | Admitting: Podiatry

## 2020-02-16 ENCOUNTER — Ambulatory Visit (INDEPENDENT_AMBULATORY_CARE_PROVIDER_SITE_OTHER): Payer: Medicare Other | Admitting: Podiatry

## 2020-02-16 ENCOUNTER — Other Ambulatory Visit: Payer: Self-pay

## 2020-02-16 VITALS — BP 126/84 | HR 58 | Temp 97.9°F | Resp 16

## 2020-02-16 DIAGNOSIS — M76822 Posterior tibial tendinitis, left leg: Secondary | ICD-10-CM | POA: Diagnosis not present

## 2020-02-16 DIAGNOSIS — M79672 Pain in left foot: Secondary | ICD-10-CM

## 2020-02-16 NOTE — Progress Notes (Signed)
   Subjective:    Patient ID: Deborah Ortiz, female    DOB: [birth year], 64 y.o.   MRN: [id number]  HPI    Review of Systems  All other systems reviewed and are negative.      Objective:   Physical Exam        Assessment & Plan:

## 2020-02-16 NOTE — Progress Notes (Signed)
Subjective:   Patient ID: Deborah Ortiz, female   DOB: 64 y.o.   MRN: [id number]   HPI Patient presents stating that her left ankle has been bothering her and states that she is not sure if it is due to having a shorter right leg.  She is try to wear some accommodation for this and is not sure as to the length of the difference   ROS      Objective:  Physical Exam  Neurovascular status intact with patient's left medial ankle being moderately tender with moderate depression of the arch no indications of muscle strength loss or other pathology and patient was found to have good digital perfusion well oriented x3     Assessment:  Posterior tibial tendinitis left with inflammation     Plan:  H&P reviewed condition and at this point I did a careful injection of the posterior tibial tendon after sterile prep 3 mg Dexasone Kenalog 5 mg Xylocaine and applied fascial brace to lift up the arch and gave instructions for shoe gear modifications.  Do not recommend heel lift currently as I did not see a significant limb length disc deformity when checked  X-rays indicate moderate depression of the arch no indications of advanced arthritis

## 2020-03-03 ENCOUNTER — Ambulatory Visit: Payer: Medicare Other | Admitting: Podiatry

## 2020-03-11 ENCOUNTER — Other Ambulatory Visit: Payer: Self-pay | Admitting: Internal Medicine

## 2020-03-11 DIAGNOSIS — E559 Vitamin D deficiency, unspecified: Secondary | ICD-10-CM

## 2020-03-19 ENCOUNTER — Ambulatory Visit (INDEPENDENT_AMBULATORY_CARE_PROVIDER_SITE_OTHER): Payer: Medicare Other | Admitting: Podiatry

## 2020-03-19 ENCOUNTER — Other Ambulatory Visit: Payer: Self-pay

## 2020-03-19 VITALS — Temp 97.1°F

## 2020-03-19 DIAGNOSIS — M76822 Posterior tibial tendinitis, left leg: Secondary | ICD-10-CM

## 2020-03-21 NOTE — Progress Notes (Signed)
Subjective:   Patient ID: Deborah Ortiz, female   DOB: 64 y.o.   MRN: [id number]   HPI Patient states her foot feels some better but she feels like she needs more support and states that she is not getting that acute pain she had prior   ROS      Objective:  Physical Exam  Neurovascular status intact with flatfoot deformity noted and reduced discomfort posterior tibial tendon as it comes under medial malleolus and inserts into the navicular     Assessment:  Improved posterior tibial tendinitis left     Plan:  Reviewed condition discussed flatfoot deformity and dispensed a over-the-counter insole to try to provide support.  I then advised on ice supportive shoes and not going barefoot and [place] patient will be seen back as needed

## 2020-03-22 ENCOUNTER — Other Ambulatory Visit: Payer: Self-pay

## 2020-03-22 ENCOUNTER — Ambulatory Visit
Admission: RE | Admit: 2020-03-22 | Discharge: 2020-03-22 | Disposition: A | Discharge status: Home / Self Care | Payer: Medicare Other | Source: Ambulatory Visit | Attending: Internal Medicine | Admitting: Internal Medicine

## 2020-03-22 DIAGNOSIS — Z1231 Encounter for screening mammogram for malignant neoplasm of breast: Secondary | ICD-10-CM

## 2020-03-22 DIAGNOSIS — E559 Vitamin D deficiency, unspecified: Secondary | ICD-10-CM

## 2020-04-13 ENCOUNTER — Other Ambulatory Visit: Payer: Self-pay

## 2020-04-13 ENCOUNTER — Ambulatory Visit (INDEPENDENT_AMBULATORY_CARE_PROVIDER_SITE_OTHER): Payer: Medicare Other | Admitting: Cardiology

## 2020-04-13 ENCOUNTER — Encounter: Payer: Self-pay | Admitting: *Deleted

## 2020-04-13 ENCOUNTER — Encounter: Payer: Self-pay | Admitting: Cardiology

## 2020-04-13 VITALS — BP 130/76 | HR 74 | Ht 64.0 in | Wt 166.6 lb

## 2020-04-13 DIAGNOSIS — I493 Ventricular premature depolarization: Secondary | ICD-10-CM | POA: Diagnosis not present

## 2020-04-13 NOTE — Progress Notes (Signed)
Patient ID: Deborah Ortiz, female   DOB: September 18, 1956, 64 y.o.   MRN: [id number] Patient enrolled for [employer] to ship a 3 day ZIO XT long term holter monitor to her home.

## 2020-04-13 NOTE — Progress Notes (Signed)
Electrophysiology Office Note   Date:  04/13/2020   ID:  Deborah, Ortiz 11/26/1955, MRN [id number]  PCP:  [name], MD  Cardiologist:   Primary Electrophysiologist:  Kortney Schoenfelder Meredith Leeds, MD    No chief complaint on file.    History of Present Illness: Deborah Ortiz is a 64 y.o. female who is being seen today for the evaluation of atrial fibrillation at the request of [name], MD. Presenting today for electrophysiology evaluation.  She has a history of paroxysmal atrial fibrillation, mitral regurgitation.  In 08/2017, she started feeling poorly after having a nail infection.  January 2019 she was found to have atrial fibrillation and was started on Xarelto, sotalol, and had a TEE guided cardioversion.  In follow-up she had recurrent atrial fibrillation with inconsistent correlation of symptoms.  She underwent ablation and had PVI on 12/2017 with cryoablation.  Sotalol was stopped post procedure due to dizziness.   Today, denies symptoms of palpitations, chest pain, shortness of breath, orthopnea, PND, lower extremity edema, claudication, dizziness, presyncope, syncope, bleeding, or neurologic sequela. The patient is tolerating medications without difficulties.  Overall she is doing well she has no chest pain or shortness of breath.  She continues to feel a thumping sensation in her chest.  She has PVCs on her ECG today that appear to be outflow tract related.  She feels the most often when she is at rest.  Past Medical History:  Diagnosis Date  . Long term (current) use of anticoagulants   . Mitral valve regurgitation   . Palpitations   . Paroxysmal atrial fibrillation (HCC)   . Pneumonia    HISTORY  . Sinusitis, acute    Past Surgical History:  Procedure Laterality Date  . CRYOABLATION     heart  . HERNIA REPAIR    . TONSILLECTOMY    . TUBAL LIGATION       Current Outpatient Medications  Medication Sig Dispense Refill  . BIOTIN PO Take by mouth  daily.    Marland Kitchen CALCIUM PO Take 1 tablet by mouth daily.    . Cholecalciferol (VITAMIN D-3) 1000 units CAPS Take 1 capsule by mouth daily.    . COLLAGEN PO Take by mouth daily.    . Glucosamine HCl (GLUCOSAMINE PO) Take 1 capsule by mouth daily.    . Multiple Vitamin (MULTIVITAMIN) capsule Take 1 capsule by mouth daily.    . Omega-3 Fatty Acids (FISH OIL PO) Take 1 capsule by mouth daily.     . Probiotic Product (PROBIOTIC PO) Take 1 tablet by mouth daily.    Alveda Reasons 20 MG TABS tablet TAKE 1 TABLET (20 MG TOTAL) BY MOUTH DAILY WITH SUPPER. 30 tablet 5   No current facility-administered medications for this visit.    Allergies:   Doxycycline and Nitrofurantoin   Social History:  The patient  reports that she quit smoking about 12 years ago. She has a 30.00 pack-year smoking history. She has never used smokeless tobacco. She reports current alcohol use. She reports that she does not use drugs.   Family History:  The patient's family history includes CVA in her mother; Cancer in her father and mother; Diabetes in her mother; Heart disease in her mother; Stroke in her mother.    ROS:  Please see the history of present illness.   Otherwise, review of systems is positive for none.   All other systems are reviewed and negative.   PHYSICAL EXAM: VS:  BP 130/76  Pulse 74   Ht 5\' 4"  (1.626 m)   Wt 166 lb 9.6 oz (75.6 kg)   SpO2 97%   BMI 28.60 kg/m  , BMI Body mass index is 28.6 kg/m. GEN: Well nourished, well developed, in no acute distress  HEENT: normal  Neck: no JVD, carotid bruits, or masses Cardiac: RRR; no murmurs, rubs, or gallops,no edema  Respiratory:  clear to auscultation bilaterally, normal work of breathing GI: soft, nontender, nondistended, + BS MS: no deformity or atrophy  Skin: warm and dry Neuro:  Strength and sensation are intact Psych: euthymic mood, full affect  EKG:  EKG is ordered today. Personal review of the ekg ordered shows sinus rhythm, PVCs  Recent  Labs: No results found for requested labs within last 8760 hours.    Lipid Panel  No results found for: CHOL, TRIG, HDL, CHOLHDL, VLDL, LDLCALC, LDLDIRECT   Wt Readings from Last 3 Encounters:  04/13/20 166 lb 9.6 oz (75.6 kg)  10/13/19 157 lb 6.4 oz (71.4 kg)  04/14/19 148 lb (67.1 kg)      Other studies Reviewed: Additional studies/ records that were reviewed today include: TTE 07/05/2018 Review of the above records today demonstrates:  Ejection fraction 55 to 60% Normal right ventricular size and function Mild left atrial dilation No significant valvular heart disease  Cardiac CT: LAD eccentric calcified plaque less than 50% Circumflex no significant stenosis RCA motion artifact throughout precludes accurate assessment Left main no significant narrowing   ASSESSMENT AND PLAN:  1.  Paroxysmal atrial fibrillation: Currently on Xarelto. Status post cryoablation 12/2017. CHA2DS2-VASc of 2.  Remains in sinus rhythm  2.  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  She at times feels a thumping in her chest.  This could certainly be due to PVCs.  We Dian Laprade order a 3-day monitor to more accurately assess her burden.   Current medicines are reviewed at length with the patient today.   The patient does not have concerns regarding her medicines.  The following changes were made today: None  Labs/ tests ordered today include:  Orders Placed This Encounter  Procedures  . LONG TERM MONITOR (3-14 DAYS)  . EKG 12-Lead     Disposition:   FU with Marlyn Tondreau 6 months  Signed, Owynn Mosqueda Meredith Leeds, MD  04/13/2020 9:56 AM     [facility] [address] [place] [address] [phone] (office) [phone] (fax)

## 2020-04-13 NOTE — Patient Instructions (Signed)
Medication Instructions:  Your physician recommends that you continue on your current medications as directed. Please refer to the Current Medication list given to you today.  *If you need a refill on your cardiac medications before your next appointment, please call your pharmacy*   Lab Work: None ordered   Testing/Procedures: Your physician has recommended that you wear a 3 day holter monitor. Holter monitors are medical devices that record the heart's electrical activity. Doctors most often use these monitors to diagnose arrhythmias. Arrhythmias are problems with the speed or rhythm of the heartbeat. The monitor is a small, portable device. You can wear one while you do your normal daily activities. This is usually used to diagnose what is causing palpitations/syncope (passing out).   Follow-Up: At [facility], you and your health needs are our priority.  As part of our continuing mission to provide you with exceptional heart care, we have created designated Provider Care Teams.  These Care Teams include your primary Cardiologist (physician) and Advanced Practice Providers (APPs -  Physician Assistants and Nurse Practitioners) who all work together to provide you with the care you need, when you need it.  We recommend signing up for the patient portal called "MyChart".  Sign up information is provided on this After Visit Summary.  MyChart is used to connect with patients for Virtual Visits (Telemedicine).  Patients are able to view lab/test results, encounter notes, upcoming appointments, etc.  Non-urgent messages can be sent to your provider as well.   To learn more about what you can do with MyChart, go to [email or web address].    Your next appointment:   6 month(s)  The format for your next appointment:   In Person  Provider:   Allegra Lai, MD   Thank you for choosing [facility]!!   Trinidad Curet, RN [phone]    Other Instructions  ZIO XT- Long Term  Monitor Instructions   Your physician has requested you wear your ZIO patch monitor_______days.   This is a single patch monitor.  [employer] supplies one patch monitor per enrollment.  Additional stickers are not available.   Please do not apply patch if you will be having a Nuclear Stress Test, Echocardiogram, Cardiac CT, MRI, or Chest Xray during the time frame you would be wearing the monitor. The patch cannot be worn during these tests.  You cannot remove and re-apply the ZIO XT patch monitor.   Your ZIO patch monitor will be sent USPS Priority mail from [facility] directly to your home address. The monitor may also be mailed to a PO BOX if home delivery is not available.   It may take 3-5 days to receive your monitor after you have been enrolled.   Once you have received you monitor, please review enclosed instructions.  Your monitor has already been registered assigning a specific monitor serial # to you.   Applying the monitor   Shave hair from upper left chest.   Hold abrader disc by orange tab.  Rub abrader in 40 strokes over left upper chest as indicated in your monitor instructions.   Clean area with 4 enclosed alcohol pads .  Use all pads to assure are is cleaned thoroughly.  Let dry.   Apply patch as indicated in monitor instructions.  Patch will be place under collarbone on left side of chest with arrow pointing upward.   Rub patch adhesive wings for 2 minutes.Remove white label marked "1".  Remove white label marked "2".  Rub patch adhesive  wings for 2 additional minutes.   While looking in a mirror, press and release button in center of patch.  A small green light will flash 3-4 times .  This will be your only indicator the monitor has been turned on.     Do not shower for the first 24 hours.  You may shower after the first 24 hours.   Press button if you feel a symptom. You will hear a small click.  Record Date, Time and Symptom in the Patient Log Book.   When  you are ready to remove patch, follow instructions on last 2 pages of Patient Log Book.  Stick patch monitor onto last page of Patient Log Book.   Place Patient Log Book in [place] box.  Use locking tab on box and tape box closed securely.  The [employer] and [employer] has [employer] on it.  Please place in mailbox as soon as possible.  Your physician should have your test results approximately 7 days after the monitor has been mailed back to [facility].   Call [place] at [phone] if you have questions regarding your ZIO XT patch monitor.  Call them immediately if you see an orange light blinking on your monitor.   If your monitor falls off in less than 4 days contact our Monitor department at [phone].  If your monitor becomes loose or falls off after 4 days call [employer] at [phone] for suggestions on securing your monitor.

## 2020-04-16 ENCOUNTER — Ambulatory Visit (INDEPENDENT_AMBULATORY_CARE_PROVIDER_SITE_OTHER): Payer: Medicare Other

## 2020-04-16 ENCOUNTER — Ambulatory Visit (INDEPENDENT_AMBULATORY_CARE_PROVIDER_SITE_OTHER): Payer: Medicare Other | Admitting: Podiatry

## 2020-04-16 ENCOUNTER — Other Ambulatory Visit: Payer: Self-pay

## 2020-04-16 ENCOUNTER — Encounter: Payer: Self-pay | Admitting: Podiatry

## 2020-04-16 DIAGNOSIS — S99922A Unspecified injury of left foot, initial encounter: Secondary | ICD-10-CM

## 2020-04-16 DIAGNOSIS — I493 Ventricular premature depolarization: Secondary | ICD-10-CM | POA: Diagnosis not present

## 2020-04-16 DIAGNOSIS — R600 Localized edema: Secondary | ICD-10-CM | POA: Diagnosis not present

## 2020-04-16 DIAGNOSIS — M779 Enthesopathy, unspecified: Secondary | ICD-10-CM

## 2020-04-16 NOTE — Progress Notes (Signed)
Subjective:   Patient ID: Deborah Ortiz, female   DOB: 64 y.o.   MRN: [id number]   HPI Patient presents stating she had severe trauma to her left foot while zip lining and its been very swollen and she is concerned about fracture stated it occurred in the last several days   ROS      Objective:  Physical Exam  Neurovascular status intact with significant edema of the left forefoot midfoot with +2 pitting edema negative Bevelyn Buckles' sign noted and multiple areas of trauma to the lateral side of the foot and into the ankle     Assessment:  Probability that were dealing here with severe trauma with possibility for fracture     Plan:  H&P reviewed condition and recommended Unna boot Ace wrap elevation rest ice and oral anti-inflammatories as needed.  Unna boot applied with Ace wrap and surgical shoe and reappoint if symptoms are not improving in the next 2 to 4 weeks  X-ray indicates midfoot trauma left and swelling but I did not see obvious fracture with or dislocation the possibility of subtle fracture that we cannot identify currently due to swelling

## 2020-04-29 ENCOUNTER — Telehealth: Payer: Self-pay | Admitting: *Deleted

## 2020-04-29 NOTE — Telephone Encounter (Signed)
Received call from pt to update on her status.  Since seeing Camnitz she sprained her foot zip lining and has been in a lot of pain.   She has been more sedentary d/t this and more "attentative" to thumping in her chest. She feels as thought it is happening a lot more than she previously recognized. She mailed her monitor back end of last week.  Informed pt that we should have results by end of week/beginning of next.   Pt aware that once received/reviewed I would call her w/ MD advisement.    (she leaves to go out of town next Thursday and is anxious about leaving prior to speaking w/ [place].  She would like a visit w/ Camnitz to further discuss findings, depending on what is found.  Aware that will address once result received. Aware that I will send a message to monitor team to be on the look out for result and to get to Dr. Curt Bears asap) Patient verbalized understanding and agreeable to plan.

## 2020-05-05 ENCOUNTER — Telehealth: Payer: Self-pay | Admitting: Cardiology

## 2020-05-05 MED ORDER — METOPROLOL SUCCINATE ER 50 MG PO TB24
50.0000 mg | ORAL_TABLET | Freq: Every day | ORAL | 3 refills | Status: DC
Start: 2020-05-05 — End: 2020-05-18

## 2020-05-05 NOTE — Telephone Encounter (Signed)
Patient calling stating she saw her heart monitor results on mychart and has some questions about traveling. She states she leaves tomorrow and will be doing some solo driving. She would like a call back today advising whether she can continue as normal.

## 2020-05-05 NOTE — Telephone Encounter (Signed)
Spoke with pt and advised per Dr Curt Bears monitor shows no AFIB and 13% PVC burden.  Pt recommended to start Toprol XL 50mg  daily.  Pt states she will start medication once she returns from her beach trip this weekend.  Pt verbalizes understanding and agrees with current plan.  Order placed for medication as prescribed.

## 2020-05-17 ENCOUNTER — Telehealth: Payer: Self-pay | Admitting: Cardiology

## 2020-05-17 NOTE — Telephone Encounter (Signed)
lmtcb

## 2020-05-17 NOTE — Telephone Encounter (Signed)
New message:     Patient calling she has some question concering some medication. She would like for you to call her.

## 2020-05-18 MED ORDER — METOPROLOL SUCCINATE ER 25 MG PO TB24
25.0000 mg | ORAL_TABLET | Freq: Every day | ORAL | 3 refills | Status: DC
Start: 2020-05-18 — End: 2020-06-15

## 2020-05-18 NOTE — Telephone Encounter (Addendum)
Pt reports the "thumping has decreased", but she is "wiped out".  She states she has no energy, extremely fatigued, dizzy if turns too fast, brain fog and feels like a blob.  She is requesting to decrease dose or different med d/t SE. Discussed w/ Camnitz.  Pt aware he is agreeable to decreasing Toprol to 25 mg daily to see if improvement in SE.  Pt agreeable to plan. She will call office if this does not improve.

## 2020-05-18 NOTE — Telephone Encounter (Signed)
Follow up  Patient is returning call. Please give patient a call back.  

## 2020-06-08 ENCOUNTER — Telehealth: Payer: Self-pay | Admitting: Cardiology

## 2020-06-08 NOTE — Telephone Encounter (Signed)
  Patient is having PVC's and she would like to discuss her medications. There was a change to her meds and she feels it has reduced the PVC's but it is still problematic.

## 2020-06-08 NOTE — Telephone Encounter (Signed)
Pt still experiencing fatigue, she states that she still doesn't trust herself to drive. Fatigue started when BB started, she takes it in evening near bedtime.  She thinks PVCs have improved slightly, but the medication SE is her biggest problem/concern. The fatigue is her biggest concern. Pt advised to keep follow up tomorrow w/ EP PA to further discuss current concern/alternatives. Patient verbalized understanding and agreeable to plan.

## 2020-06-08 NOTE — Progress Notes (Signed)
Cardiology Office Note Date:  06/09/2020  Patient ID:  Deborah Ortiz, Deborah Ortiz 1956/07/27, MRN [id number] PCP:  [name], MD  Electrophysiologist:  Dr. Curt Bears     Chief Complaint:  Weak, near syncope  History of Present Illness: Deborah Ortiz is a 64 y.o. female with history of AFib, PVCs.  She comes in today to be seen for Dr. Curt Bears.  Last seen by him July 2021.  She was having some thumping type palpitations noted to have PVCs on her EKG (apeared RVOT) and planned for 3 day monitor to evaluate burden  Max 187 bpm 07:27pm, 07/18 Min 52 bpm 05:22am, 07/18 Avg 73 bpm 1% PACs 13.4% PVCs 26 VT runs, longest 5 beats 9 SVT runs noted, longest 27 seconds at 116 bpm Triggered events associated with sinus rhythm, PVCs, SVT  Toprol 50mg  was added to her tx Subsequently this has been decreased to 25mg  daiy feeling poorly with the addition of the metoprolol.  TODAY She called yesterday requesting to be seen feeling poorly. She while in the waiting room, suddenly began feeling weak, clammy, and was brought back in a wheelchair, she appeared slightly pale, and clammy. Once back in the room the RN got a manual BP of 138/95, the patient starting to perk up some, and EKG noted SR 72, without acute changes.  The patient continued to feel more improved as a few minutes passed.    She is accompanied by her daughter. She tells me that when she first arrived she felt well, they were talking and a wave of weakness came over her. This is why she came today. Since she started the Toprol she has felt fairly terrible. Very weak, tired, and severe "brain fog" trouble concentrating and generally awful.  On the 50mg  felt like she soul barely function some days. In communication with the office, she reduced the dose to 25mg  daily and taking it at night and noted improvement but still generally felt poorly was starting to notice she was getting some better days that she was able to get out and  about some, and did note that her PVCs/thumping beats had lessened but not resolved. She started to note that about the same time every day (usually about 1:00PM) she would feel very weak, to the point sometimes that she just has to lay down, feels extremely tired and fatigued.  She notes a pinching feeling in the center of her chest though no racing, palpitations, perhaps when things are settling a bit some of the PVCs are noticed. She does not note palpitations as a trigger. She does mention that when she first was found to have Afib she felt very weak and tired as well, but these do not seem to have palpitations associated with them These episodes leave her feeling extremely tired and worn out, often followed by GI upset, the urge to use the bathroom, sometimes diarrhea.  The acute episode is just like today, lasting about 5 minutes, then about 15 minutes until really feeling like she is better.  She will start to feel better or back to her new baseline about 4 hours later.  She has not had syncope.  She had not had symptoms like this until starting the metoprolol, and felt less poorly on the lowered dose. She mentions that she has always had very poor tolerances to medicines and vaccines. She had COVID vaccines in feb and both doses made her feel ill for 3-4 days, she recently had her 1st shingle vaccine  and felt quite poorly with that as well. She and her daughter both mention with any medicines she tends to feel bad.   Afib Hx Diagnosed Jan 2019 PVI (cryo) ablation April 2019 (done in [place])  Past Medical History:  Diagnosis Date  . Long term (current) use of anticoagulants   . Mitral valve regurgitation   . Palpitations   . Paroxysmal atrial fibrillation (HCC)   . Pneumonia    HISTORY  . Sinusitis, acute     Past Surgical History:  Procedure Laterality Date  . CRYOABLATION     heart  . HERNIA REPAIR    . TONSILLECTOMY    . TUBAL LIGATION      Current Outpatient Medications   Medication Sig Dispense Refill  . BIOTIN PO Take by mouth daily.    Marland Kitchen CALCIUM PO Take 1 tablet by mouth daily.    . Cholecalciferol (VITAMIN D-3) 1000 units CAPS Take 1 capsule by mouth daily.    . COLLAGEN PO Take by mouth daily.    . Glucosamine HCl (GLUCOSAMINE PO) Take 1 capsule by mouth daily.    . metoprolol succinate (TOPROL XL) 25 MG 24 hr tablet Take 1 tablet (25 mg total) by mouth daily. 30 tablet 3  . Multiple Vitamin (MULTIVITAMIN) capsule Take 1 capsule by mouth daily.    . Omega-3 Fatty Acids (FISH OIL PO) Take 1 capsule by mouth daily.     . Probiotic Product (PROBIOTIC PO) Take 1 tablet by mouth daily.    Alveda Reasons 20 MG TABS tablet TAKE 1 TABLET (20 MG TOTAL) BY MOUTH DAILY WITH SUPPER. 30 tablet 5   No current facility-administered medications for this visit.    Allergies:   Doxycycline and Nitrofurantoin   Social History:  The patient  reports that she quit smoking about 12 years ago. She has a 30.00 pack-year smoking history. She has never used smokeless tobacco. She reports current alcohol use. She reports that she does not use drugs.   Family History:  The patient's family history includes CVA in her mother; Cancer in her father and mother; Diabetes in her mother; Heart disease in her mother; Stroke in her mother.  ROS:  Please see the history of present illness.  All other systems are reviewed and otherwise negative.   PHYSICAL EXAM:  VS:  There were no vitals taken for this visit. BMI: There is no height or weight on file to calculate BMI. Well nourished, well developed, in no acute distress  HEENT: normocephalic, atraumatic  Neck: no JVD, carotid bruits or masses Cardiac:  RRR; no significant murmurs, no rubs, or gallops Lungs:  CTA b/l, no wheezing, rhonchi or rales  Abd: soft, nontender MS: no deformity or atrophy Ext:  no edema  Skin: warm and dry, no rash Neuro:  No gross deficits appreciated Psych: euthymic mood, full affect   EKG:  Done today  and reviewed by myself shows  SR, 72bpm, no ST/T changes    July 2021: 3 day monitor Max 187 bpm 07:27pm, 07/18 Min 52 bpm 05:22am, 07/18 Avg 73 bpm 1% PACs 13.4% PVCs 26 VT runs, longest 5 beats 9 SVT runs noted, longest 27 seconds at 116 bpm Triggered events associated with sinus rhythm, PVCs, SVT   05/08/2018: TEE LVEF 55-60%, mild conc LVH Normal RV No significant VHD    Recent Labs: No results found for requested labs within last 8760 hours.  No results found for requested labs within last 8760 hours.   CrCl  cannot be calculated (Patient's most recent lab result is older than the maximum 21 days allowed.).   Wt Readings from Last 3 Encounters:  04/13/20 166 lb 9.6 oz (75.6 kg)  10/13/19 157 lb 6.4 oz (71.4 kg)  04/14/19 148 lb (67.1 kg)     Other studies reviewed: Additional studies/records reviewed today include: summarized above  ASSESSMENT AND PLAN:  1. Paroxysmal Afib     CHA2DS2Vasc is 2 (including gender), on xarelto appropriately dosed     No bleeding or signs of bleeding  2. PVCs     Improved  3. Weak spells as discussed above     Her symptoms sound of BP, ?vagal of some kind     palpitations so not seem to be a part of her constellation of symptoms, she has not made any notation of HR or BP, though was surprised that her BP was high here  Orthostatics Supine 160/88, 62bpm  Sitting 146/89, 68 Standing 138/84, 62  She felt a general sense of mild brain fog, no other symptoms, and unchanged during her changes in position She reports her usual BP 120's  At the end of our visit she is feeling better, she had ambulated twice to the bathroom without difficulty and reports back to her baseline.  We will stop her metoprolol, she is worried about stopping cold [place], and will take none tonight, 1/2 tab tomorrow, and then stop. We will need to revisit management of her PVCs though would like the BB washed out and have her feeling better before trying  another medication.  BMET, mag, and CBC today  Disposition: she has an appt with Dr. Curt Bears next week.  Current medicines are reviewed at length with the patient today.  The patient did not have any concerns regarding medicines.  Venetia Night, PA-C 06/09/2020 7:41 PM     [address] [place] [address] [phone] (office)  [phone] (fax)

## 2020-06-09 ENCOUNTER — Other Ambulatory Visit: Payer: Self-pay

## 2020-06-09 ENCOUNTER — Ambulatory Visit (INDEPENDENT_AMBULATORY_CARE_PROVIDER_SITE_OTHER): Payer: Medicare Other | Admitting: Physician Assistant

## 2020-06-09 DIAGNOSIS — R55 Syncope and collapse: Secondary | ICD-10-CM | POA: Diagnosis not present

## 2020-06-09 DIAGNOSIS — I480 Paroxysmal atrial fibrillation: Secondary | ICD-10-CM

## 2020-06-09 DIAGNOSIS — I493 Ventricular premature depolarization: Secondary | ICD-10-CM | POA: Diagnosis not present

## 2020-06-09 DIAGNOSIS — Z79899 Other long term (current) drug therapy: Secondary | ICD-10-CM | POA: Diagnosis not present

## 2020-06-09 NOTE — Patient Instructions (Addendum)
Medication Instructions:   NO METOPROLOL TONIGHT   1/2 TABLET TOMORROW NIGHT  THEN STOP   *If you need a refill on your cardiac medications before your next appointment, please call your pharmacy*   Lab Work: BMET MAG AND CBC TODAY   If you have labs (blood work) drawn today and your tests are completely normal, you will receive your results only by: Marland Kitchen MyChart Message (if you have MyChart) OR . A paper copy in the mail If you have any lab test that is abnormal or we need to change your treatment, we will call you to review the results.   Testing/Procedures: NONE ORDERED  TODAY    Follow-Up: At [facility], you and your health needs are our priority.  As part of our continuing mission to provide you with exceptional heart care, we have created designated Provider Care Teams.  These Care Teams include your primary Cardiologist (physician) and Advanced Practice Providers (APPs -  Physician Assistants and Nurse Practitioners) who all work together to provide you with the care you need, when you need it.  We recommend signing up for the patient portal called "MyChart".  Sign up information is provided on this After Visit Summary.  MyChart is used to connect with patients for Virtual Visits (Telemedicine).  Patients are able to view lab/test results, encounter notes, upcoming appointments, etc.  Non-urgent messages can be sent to your provider as well.   To learn more about what you can do with MyChart, go to [email or web address].    Your next appointment:   1 week(s)  The format for your next appointment:   In Person  Provider:   You may see Will Meredith Leeds, MD or one of the following Advanced Practice Providers on your designated Care Team:    Chanetta Marshall, NP  Tommye Standard, PA-C  Legrand Como "Oda Kilts, [place]    Other Instructions

## 2020-06-10 LAB — BASIC METABOLIC PANEL
BUN/Creatinine Ratio: 24 (ref 12–28)
BUN: 16 mg/dL (ref 8–27)
CO2: 26 mmol/L (ref 20–29)
Calcium: 10 mg/dL (ref 8.7–10.3)
Chloride: 103 mmol/L (ref 96–106)
Creatinine, Ser: 0.66 mg/dL (ref 0.57–1.00)
GFR calc Af Amer: 106 mL/min/{1.73_m2} (ref >59)
GFR calc non Af Amer: 92 mL/min/{1.73_m2} (ref >59)
Glucose: 85 mg/dL (ref 65–99)
Potassium: 4.1 mmol/L (ref 3.5–5.2)
Sodium: 145 mmol/L — ABNORMAL HIGH (ref 134–144)

## 2020-06-10 LAB — CBC
Hematocrit: 43.5 % (ref 34.0–46.6)
Hemoglobin: 15.1 g/dL (ref 11.1–15.9)
MCH: 31.7 pg (ref 26.6–33.0)
MCHC: 34.7 g/dL (ref 31.5–35.7)
MCV: 91 fL (ref 79–97)
Platelets: 197 10*3/uL (ref 150–450)
RBC: 4.77 x10E6/uL (ref 3.77–5.28)
RDW: 12.4 % (ref 11.7–15.4)
WBC: 8 10*3/uL (ref 3.4–10.8)

## 2020-06-10 LAB — MAGNESIUM: Magnesium: 2.1 mg/dL (ref 1.6–2.3)

## 2020-06-11 NOTE — Addendum Note (Signed)
Addended by: Thora Lance on: 06/11/2020 03:41 PM   Modules accepted: Orders

## 2020-06-15 ENCOUNTER — Other Ambulatory Visit: Payer: Self-pay

## 2020-06-15 ENCOUNTER — Encounter: Payer: Self-pay | Admitting: Cardiology

## 2020-06-15 ENCOUNTER — Ambulatory Visit (INDEPENDENT_AMBULATORY_CARE_PROVIDER_SITE_OTHER): Payer: Medicare Other | Admitting: Cardiology

## 2020-06-15 VITALS — BP 114/76 | HR 80 | Ht 64.0 in | Wt 171.0 lb

## 2020-06-15 DIAGNOSIS — I493 Ventricular premature depolarization: Secondary | ICD-10-CM | POA: Diagnosis not present

## 2020-06-15 NOTE — Progress Notes (Signed)
Electrophysiology Office Note   Date:  06/15/2020   ID:  Deborah Ortiz, Deborah Ortiz 13-Oct-1955, MRN [id number]  PCP:  [name], MD  Cardiologist:   Primary Electrophysiologist:  Adda Stokes Meredith Leeds, MD    No chief complaint on file.    History of Present Illness: Deborah Ortiz is a 64 y.o. female who is being seen today for the evaluation of atrial fibrillation at the request of [name], MD. Presenting today for electrophysiology evaluation.  She has a history of paroxysmal atrial fibrillation, mitral regurgitation.  In 08/2017, she started feeling poorly after having a nail infection.  January 2019 she was found to have atrial fibrillation and was started on Xarelto, sotalol, and had a TEE guided cardioversion.  In follow-up she had recurrent atrial fibrillation with inconsistent correlation of symptoms.  She underwent ablation and had PVI on 12/2017 with cryoablation.  Sotalol was stopped post procedure due to dizziness.  She was noted to have PVCs and wore cardiac monitor that showed a 13% burden.  She was started on Toprol-XL.  Unfortunately she had severe symptoms with this medication of brain fog.  Since stopping the medicine she has done much better.  Her blood pressure and heart rate have been well controlled.  She states that while being on the medication, at around 3:00 in the afternoon she would hit a wall and have these severe symptoms.  This is gotten to the point where she is no longer driving in the afternoon.  Today, denies symptoms of palpitations, chest pain, shortness of breath, orthopnea, PND, lower extremity edema, claudication, dizziness, presyncope, syncope, bleeding, or neurologic sequela. The patient is tolerating medications without difficulties.  Since stopping the metoprolol she has felt much better.  This is occurred over the last 2 days.  She is continued to have mild of thumping in her chest, but this is not nearly as bad as her symptoms on  metoprolol.  Past Medical History:  Diagnosis Date  . Long term (current) use of anticoagulants   . Mitral valve regurgitation   . Palpitations   . Paroxysmal atrial fibrillation (HCC)   . Pneumonia    HISTORY  . Sinusitis, acute    Past Surgical History:  Procedure Laterality Date  . CRYOABLATION     heart  . HERNIA REPAIR    . TONSILLECTOMY    . TUBAL LIGATION       Current Outpatient Medications  Medication Sig Dispense Refill  . BIOTIN PO Take by mouth daily.    Marland Kitchen CALCIUM PO Take 1 tablet by mouth daily.    . Cholecalciferol (VITAMIN D-3) 1000 units CAPS Take 1 capsule by mouth daily.    . COLLAGEN PO Take by mouth daily.    . Glucosamine HCl (GLUCOSAMINE PO) Take 1 capsule by mouth daily.    . Multiple Vitamin (MULTIVITAMIN) capsule Take 1 capsule by mouth daily.    . Omega-3 Fatty Acids (FISH OIL PO) Take 1 capsule by mouth daily.     . Probiotic Product (PROBIOTIC PO) Take 1 tablet by mouth daily.    Alveda Reasons 20 MG TABS tablet TAKE 1 TABLET (20 MG TOTAL) BY MOUTH DAILY WITH SUPPER. 30 tablet 5   No current facility-administered medications for this visit.    Allergies:   Doxycycline and Nitrofurantoin   Social History:  The patient  reports that she quit smoking about 12 years ago. She has a 30.00 pack-year smoking history. She has never used smokeless tobacco. She  reports current alcohol use. She reports that she does not use drugs.   Family History:  The patient's family history includes CVA in her mother; Cancer in her father and mother; Diabetes in her mother; Heart disease in her mother; Stroke in her mother.    ROS:  Please see the history of present illness.   Otherwise, review of systems is positive for none.   All other systems are reviewed and negative.   PHYSICAL EXAM: VS:  BP 114/76   Pulse 80   Ht 5\' 4"  (1.626 m)   Wt 171 lb (77.6 kg)   SpO2 98%   BMI 29.35 kg/m  , BMI Body mass index is 29.35 kg/m. GEN: Well nourished, well developed, in no  acute distress  HEENT: normal  Neck: no JVD, carotid bruits, or masses Cardiac: RRR; no murmurs, rubs, or gallops,no edema  Respiratory:  clear to auscultation bilaterally, normal work of breathing GI: soft, nontender, nondistended, + BS MS: no deformity or atrophy  Skin: warm and dry Neuro:  Strength and sensation are intact Psych: euthymic mood, full affect  EKG:  EKG is not ordered today. Personal review of the ekg ordered 06/09/20 shows sinus rhythm, rate 72  Recent Labs: 06/09/2020: BUN 16; Creatinine, Ser 0.66; Hemoglobin 15.1; Magnesium 2.1; Platelets 197; Potassium 4.1; Sodium 145    Lipid Panel  No results found for: CHOL, TRIG, HDL, CHOLHDL, VLDL, LDLCALC, LDLDIRECT   Wt Readings from Last 3 Encounters:  06/15/20 171 lb (77.6 kg)  04/13/20 166 lb 9.6 oz (75.6 kg)  10/13/19 157 lb 6.4 oz (71.4 kg)      Other studies Reviewed: Additional studies/ records that were reviewed today include: TTE 07/05/2018 Review of the above records today demonstrates:  Ejection fraction 55 to 60% Normal right ventricular size and function Mild left atrial dilation No significant valvular heart disease  Cardiac CT: LAD eccentric calcified plaque less than 50% Circumflex no significant stenosis RCA motion artifact throughout precludes accurate assessment Left main no significant narrowing  Cardiac monitor 05/04/2020 personally reviewed Max 187 bpm 07:27pm, 07/18 Min 52 bpm 05:22am, 07/18 Avg 73 bpm 1% PACs 13.4% PVCs 26 VT runs, longest 5 beats 9 SVT runs noted, longest 27 seconds at 116 bpm Triggered events associated with sinus rhythm, PVCs, SVT   ASSESSMENT AND PLAN:  1.  Paroxysmal atrial fibrillation: Currently on Xarelto.  Status post cryoablation.  CHA2DS2-VASc of 2.  Remains in sinus rhythm.    2.  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  She wore a cardiac monitor that showed a 13% burden.  She initially tried metoprolol but  had severe symptoms.  Due to that, we Mamie Hundertmark hold off on any further medications.  She would potentially benefit from antiarrhythmic such as flecainide or mexiletine.  We Marcelle Bebout see her back in 3 months for further evaluation.   Current medicines are reviewed at length with the patient today.   The patient does not have concerns regarding her medicines.  The following changes were made today: None  Labs/ tests ordered today include:  No orders of the defined types were placed in this encounter.    Disposition:   FU with Ervin Hensley 3 months  Signed, Jevaeh Shams Meredith Leeds, MD  06/15/2020 3:35 PM     [address] [place] [place] [address] [phone] (office) [phone] (fax)

## 2020-06-15 NOTE — Patient Instructions (Signed)
Medication Instructions:  Your physician recommends that you continue on your current medications as directed. Please refer to the Current Medication list given to you today.  *If you need a refill on your cardiac medications before your next appointment, please call your pharmacy*   Lab Work: None ordered   Testing/Procedures: None ordered   Follow-Up: At [facility], you and your health needs are our priority.  As part of our continuing mission to provide you with exceptional heart care, we have created designated Provider Care Teams.  These Care Teams include your primary Cardiologist (physician) and Advanced Practice Providers (APPs -  Physician Assistants and Nurse Practitioners) who all work together to provide you with the care you need, when you need it.  Your next appointment:   3 month(s)  The format for your next appointment:   In Person  Provider:   Will Camnitz, MD    Thank you for choosing [facility]!!   Anastashia Westerfeld, RN [phone]     

## 2020-07-01 ENCOUNTER — Other Ambulatory Visit: Payer: Self-pay | Admitting: Cardiology

## 2020-07-01 NOTE — Telephone Encounter (Signed)
Xarelto 20mg  refill request received. Pt is 64 years old, weight-77.6kg, Crea-0.66 on 06/09/2020, last seen by Dr. Curt Bears on 06/15/2020, Diagnosis-Afib, [place].75ml/min; Dose is appropriate based on dosing criteria. Will send in refill to requested pharmacy.

## 2020-09-04 ENCOUNTER — Encounter: Payer: Self-pay | Admitting: Nurse Practitioner

## 2020-09-04 ENCOUNTER — Other Ambulatory Visit: Payer: Self-pay | Admitting: Nurse Practitioner

## 2020-09-04 ENCOUNTER — Telehealth: Payer: Self-pay | Admitting: Nurse Practitioner

## 2020-09-04 DIAGNOSIS — I340 Nonrheumatic mitral (valve) insufficiency: Secondary | ICD-10-CM

## 2020-09-04 DIAGNOSIS — Z6829 Body mass index (BMI) 29.0-29.9, adult: Secondary | ICD-10-CM

## 2020-09-04 DIAGNOSIS — Z7901 Long term (current) use of anticoagulants: Secondary | ICD-10-CM

## 2020-09-04 DIAGNOSIS — U071 COVID-19: Secondary | ICD-10-CM

## 2020-09-04 NOTE — Progress Notes (Signed)
Patient reached back out to NP on call for mAb infusion clinic requesting to be placed on the schedule.   I connected by phone with Deborah Ortiz on 09/04/2020 at 4:34 PM to discuss the potential use of a new treatment for mild to moderate COVID-19 viral infection in non-hospitalized patients.  This patient is a 64 y.o. female that meets the FDA criteria for Emergency Use Authorization of COVID monoclonal antibody casirivimab/imdevimab, bamlanivimab/eteseviamb, or sotrovimab.  Has a (+) direct SARS-CoV-2 viral test result  Has mild or moderate COVID-19   Is NOT hospitalized due to COVID-19  Is within 10 days of symptom onset  Has at least one of the high risk factor(s) for progression to severe COVID-19 and/or hospitalization as defined in EUA.  Specific high risk criteria : Older age (>/= 66 yo), BMI > 25 and Cardiovascular disease or hypertension   I have spoken and communicated the following to the patient or parent/caregiver regarding COVID monoclonal antibody treatment:  1. FDA has authorized the emergency use for the treatment of mild to moderate COVID-19 in adults and pediatric patients with positive results of direct SARS-CoV-2 viral testing who are 37 years of age and older weighing at least 40 kg, and who are at high risk for progressing to severe COVID-19 and/or hospitalization.  2. The significant known and potential risks and benefits of COVID monoclonal antibody, and the extent to which such potential risks and benefits are unknown.  3. Information on available alternative treatments and the risks and benefits of those alternatives, including clinical trials.  4. Patients treated with COVID monoclonal antibody should continue to self-isolate and use infection control measures (e.g., wear mask, isolate, social distance, avoid sharing personal items, clean and disinfect "high touch" surfaces, and frequent handwashing) according to CDC guidelines.   5. The patient or  parent/caregiver has the option to accept or refuse COVID monoclonal antibody treatment.  After reviewing this information with the patient, the patient has agreed to receive one of the available covid 19 monoclonal antibodies and will be provided an appropriate fact sheet prior to infusion. [name], NP 09/04/2020 4:34 PM

## 2020-09-04 NOTE — Telephone Encounter (Signed)
COVID-19 MAB Treatment Evaluation **Patient has requested that we not reach out to her, but she will notify us if she is interested with a return call to the hotline.   Qualifiers: Age >/= 61, BMI >25 and Cardiovascular Disease Symptom Onset: 11/29-evening Day: 6 Symptoms Present: Covid Symptoms: Body Ache, Cough, Congestion and Sinus Symptoms Test:does  have a confirmed positive test result  I contacted the patient, Deborah Ortiz via telephone on behalf of the [place] to discuss how they are feeling since COVID-19 diagnosis, current symptoms, and to determine if the patient qualifies for MAB Infusion as a treatment option.   Based on information received, it has been determined that the patient is considered at high risk for complications from OVPCH-40 infection and  does qualify for MAB treatment at this time based on risk factors and/or length of current illness.    After discussion about the reason for treatment, the risk associated with treatment, and the risk factors that place them at high risk for complications, the patientdoes not  wish to review further information about the treatment offered at this time.   The patient informs me that her PCP recommended against the infusion as a treatment option for her when she was first diagnosed and she does not wish to pursue this option at this time.   The patient will be marked declined, however, information has been provided for the patient to reach out to the [place] clinic through the hotline number if she should decide to receive the infusion and was encouraged to reach out with any questions or concerns.   The patient will remain qualified up to day 10 from the time of symptom onset and may change their mind about treatment at any time. Discussed with the patient that she will remain eligible for infusion treatment through 09/08/2020.    If further questions or concerns arise, the patient has been  encouraged to contact the MAB Infusion Hotline at [phone] to leave a message for call back from one of our providers.  Worthy Keeler, DNP, AGNP-c COVID Monoclonal Antibody Treatment [facility]

## 2020-09-07 ENCOUNTER — Ambulatory Visit (HOSPITAL_COMMUNITY)
Admission: RE | Admit: 2020-09-07 | Discharge: 2020-09-07 | Disposition: A | Discharge status: Home / Self Care | Payer: Medicare Other | Source: Ambulatory Visit | Attending: Pulmonary Disease | Admitting: Pulmonary Disease

## 2020-09-07 ENCOUNTER — Other Ambulatory Visit (HOSPITAL_COMMUNITY): Payer: Self-pay

## 2020-09-07 ENCOUNTER — Ambulatory Visit: Payer: Medicare Other | Admitting: Cardiology

## 2020-09-07 DIAGNOSIS — Z7901 Long term (current) use of anticoagulants: Secondary | ICD-10-CM | POA: Insufficient documentation

## 2020-09-07 DIAGNOSIS — Z6829 Body mass index (BMI) 29.0-29.9, adult: Secondary | ICD-10-CM | POA: Diagnosis present

## 2020-09-07 DIAGNOSIS — U071 COVID-19: Secondary | ICD-10-CM

## 2020-09-07 DIAGNOSIS — Z23 Encounter for immunization: Secondary | ICD-10-CM | POA: Insufficient documentation

## 2020-09-07 DIAGNOSIS — I340 Nonrheumatic mitral (valve) insufficiency: Secondary | ICD-10-CM | POA: Diagnosis present

## 2020-09-07 MED ORDER — FAMOTIDINE IN NACL 20-0.9 MG/50ML-% IV SOLN: 20.0000 mg | Freq: Once | INTRAVENOUS | Status: DC | PRN

## 2020-09-07 MED ORDER — EPINEPHRINE 0.3 MG/0.3ML IJ SOAJ: 0.3000 mg | Freq: Once | INTRAMUSCULAR | Status: DC | PRN

## 2020-09-07 MED ORDER — SODIUM CHLORIDE 0.9 % IV SOLN: INTRAVENOUS | Status: DC | PRN

## 2020-09-07 MED ORDER — DIPHENHYDRAMINE HCL 50 MG/ML IJ SOLN: 50.0000 mg | Freq: Once | INTRAMUSCULAR | Status: DC | PRN

## 2020-09-07 MED ORDER — ALBUTEROL SULFATE HFA 108 (90 BASE) MCG/ACT IN AERS: 2.0000 | INHALATION_SPRAY | Freq: Once | RESPIRATORY_TRACT | Status: DC | PRN

## 2020-09-07 MED ORDER — SODIUM CHLORIDE 0.9 % IV SOLN: Freq: Once | INTRAVENOUS | Status: AC

## 2020-09-07 MED ORDER — METHYLPREDNISOLONE SODIUM SUCC 125 MG IJ SOLR: 125.0000 mg | Freq: Once | INTRAMUSCULAR | Status: DC | PRN

## 2020-09-07 NOTE — Discharge Instructions (Signed)
10 Things You Can Do to Manage Your COVID-19 Symptoms at Home If you have possible or confirmed COVID-19: 1. Stay home from work and school. And stay away from other public places. If you must go out, avoid using any kind of public transportation, ridesharing, or taxis. 2. Monitor your symptoms carefully. If your symptoms get worse, call your healthcare provider immediately. 3. Get rest and stay hydrated. 4. If you have a medical appointment, call the healthcare provider ahead of time and tell them that you have or may have COVID-19. 5. For medical emergencies, call 911 and notify the dispatch personnel that you have or may have COVID-19. 6. Cover your cough and sneezes with a tissue or use the inside of your elbow. 7. Wash your hands often with soap and water for at least 20 seconds or clean your hands with an alcohol-based hand sanitizer that contains at least 60% alcohol. 8. As much as possible, stay in a specific room and away from other people in your home. Also, you should use a separate bathroom, if available. If you need to be around other people in or outside of the home, wear a mask. 9. Avoid sharing personal items with other people in your household, like dishes, towels, and bedding. 10. Clean all surfaces that are touched often, like counters, tabletops, and doorknobs. Use household cleaning sprays or wipes according to the label instructions. cdc.gov/coronavirus 04/02/2019 This information is not intended to replace advice given to you by your health care provider. Make sure you discuss any questions you have with your health care provider. Document Revised: 09/04/2019 Document Reviewed: 09/04/2019 Elsevier Patient Education  2020 Elsevier Inc. What types of side effects do monoclonal antibody drugs cause?  Common side effects  In general, the more common side effects caused by monoclonal antibody drugs include: . Allergic reactions, such as hives or itching . Flu-like signs and  symptoms, including chills, fatigue, fever, and muscle aches and pains . Nausea, vomiting . Diarrhea . Skin rashes . Low blood pressure   The CDC is recommending patients who receive monoclonal antibody treatments wait at least 90 days before being vaccinated.  Currently, there are no data on the safety and efficacy of mRNA COVID-19 vaccines in persons who received monoclonal antibodies or convalescent plasma as part of COVID-19 treatment. Based on the estimated half-life of such therapies as well as evidence suggesting that reinfection is uncommon in the 90 days after initial infection, vaccination should be deferred for at least 90 days, as a precautionary measure until additional information becomes available, to avoid interference of the antibody treatment with vaccine-induced immune responses. If you have any questions or concerns after the infusion please call the Advanced Practice Provider on call at [phone]. This number is ONLY intended for your use regarding questions or concerns about the infusion post-treatment side-effects.  Please do not provide this number to others for use. For return to work notes please contact your primary care provider.   If someone you know is interested in receiving treatment please have them call the COVID hotline at [phone].   

## 2020-09-07 NOTE — Progress Notes (Signed)
  Diagnosis: COVID-19  Physician: Dr. Joya Gaskins  Procedure: Covid Infusion Clinic Med: casirivimab\imdevimab infusion - Provided patient with casirivimab\imdevimab fact sheet for patients, parents and caregivers prior to infusion.  Complications: No immediate complications noted.  Discharge: Discharged home   Deborah Ortiz 09/07/2020

## 2020-09-07 NOTE — Progress Notes (Signed)
Patient reviewed Fact Sheet for Patients, Parents, and Caregivers for Emergency Use Authorization (EUA) of Sotrovimab for the Treatment of Coronavirus. Patient also reviewed and is agreeable to the estimated cost of treatment. Patient is agreeable to proceed.   

## 2020-09-25 ENCOUNTER — Observation Stay (HOSPITAL_COMMUNITY)
Admission: EM | Admit: 2020-09-25 | Discharge: 2020-09-26 | Disposition: A | Discharge status: Home / Self Care | Payer: Medicare Other | Attending: Internal Medicine | Admitting: Internal Medicine

## 2020-09-25 ENCOUNTER — Emergency Department (HOSPITAL_COMMUNITY): Payer: Medicare Other

## 2020-09-25 ENCOUNTER — Other Ambulatory Visit: Payer: Self-pay

## 2020-09-25 ENCOUNTER — Encounter (HOSPITAL_COMMUNITY): Payer: Self-pay

## 2020-09-25 DIAGNOSIS — Z79899 Other long term (current) drug therapy: Secondary | ICD-10-CM | POA: Diagnosis not present

## 2020-09-25 DIAGNOSIS — R5383 Other fatigue: Secondary | ICD-10-CM | POA: Diagnosis present

## 2020-09-25 DIAGNOSIS — R531 Weakness: Secondary | ICD-10-CM

## 2020-09-25 DIAGNOSIS — U099 Post covid-19 condition, unspecified: Secondary | ICD-10-CM

## 2020-09-25 DIAGNOSIS — I480 Paroxysmal atrial fibrillation: Secondary | ICD-10-CM | POA: Diagnosis not present

## 2020-09-25 DIAGNOSIS — R5382 Chronic fatigue, unspecified: Secondary | ICD-10-CM | POA: Diagnosis not present

## 2020-09-25 DIAGNOSIS — G9332 Myalgic encephalomyelitis/chronic fatigue syndrome: Secondary | ICD-10-CM | POA: Diagnosis present

## 2020-09-25 DIAGNOSIS — J432 Centrilobular emphysema: Secondary | ICD-10-CM | POA: Diagnosis present

## 2020-09-25 DIAGNOSIS — Z87891 Personal history of nicotine dependence: Secondary | ICD-10-CM | POA: Diagnosis not present

## 2020-09-25 DIAGNOSIS — Z7901 Long term (current) use of anticoagulants: Secondary | ICD-10-CM | POA: Diagnosis not present

## 2020-09-25 LAB — URINALYSIS, ROUTINE W REFLEX MICROSCOPIC
Bilirubin Urine: NEGATIVE
Glucose, UA: NEGATIVE mg/dL
Hgb urine dipstick: NEGATIVE
Ketones, ur: NEGATIVE mg/dL
Leukocytes,Ua: NEGATIVE
Nitrite: NEGATIVE
Protein, ur: NEGATIVE mg/dL
Specific Gravity, Urine: 1.011 (ref 1.005–1.030)
pH: 6 (ref 5.0–8.0)

## 2020-09-25 LAB — TROPONIN I (HIGH SENSITIVITY)
Troponin I (High Sensitivity): 5 ng/L (ref <18)
Troponin I (High Sensitivity): 6 ng/L (ref <18)

## 2020-09-25 LAB — CBC WITH DIFFERENTIAL/PLATELET
Abs Immature Granulocytes: 0.02 10*3/uL (ref 0.00–0.07)
Basophils Absolute: 0 10*3/uL (ref 0.0–0.1)
Basophils Relative: 0 %
Eosinophils Absolute: 0.1 10*3/uL (ref 0.0–0.5)
Eosinophils Relative: 1 %
HCT: 41.1 % (ref 36.0–46.0)
Hemoglobin: 13.3 g/dL (ref 12.0–15.0)
Immature Granulocytes: 0 %
Lymphocytes Relative: 29 %
Lymphs Abs: 2.2 10*3/uL (ref 0.7–4.0)
MCH: 30.9 pg (ref 26.0–34.0)
MCHC: 32.4 g/dL (ref 30.0–36.0)
MCV: 95.6 fL (ref 80.0–100.0)
Monocytes Absolute: 0.7 10*3/uL (ref 0.1–1.0)
Monocytes Relative: 10 %
Neutro Abs: 4.6 10*3/uL (ref 1.7–7.7)
Neutrophils Relative %: 60 %
Platelets: 182 10*3/uL (ref 150–400)
RBC: 4.3 MIL/uL (ref 3.87–5.11)
RDW: 13.2 % (ref 11.5–15.5)
WBC: 7.6 10*3/uL (ref 4.0–10.5)
nRBC: 0 % (ref 0.0–0.2)

## 2020-09-25 LAB — COMPREHENSIVE METABOLIC PANEL
ALT: 31 U/L (ref 0–44)
AST: 33 U/L (ref 15–41)
Albumin: 3.4 g/dL — ABNORMAL LOW (ref 3.5–5.0)
Alkaline Phosphatase: 57 U/L (ref 38–126)
Anion gap: 9 (ref 5–15)
BUN: 22 mg/dL (ref 8–23)
CO2: 25 mmol/L (ref 22–32)
Calcium: 9 mg/dL (ref 8.9–10.3)
Chloride: 105 mmol/L (ref 98–111)
Creatinine, Ser: 0.76 mg/dL (ref 0.44–1.00)
GFR, Estimated: >60 mL/min (ref >60)
Glucose, Bld: 106 mg/dL — ABNORMAL HIGH (ref 70–99)
Potassium: 4.5 mmol/L (ref 3.5–5.1)
Sodium: 139 mmol/L (ref 135–145)
Total Bilirubin: 0.7 mg/dL (ref 0.3–1.2)
Total Protein: 6.4 g/dL — ABNORMAL LOW (ref 6.5–8.1)

## 2020-09-25 LAB — SEDIMENTATION RATE: Sed Rate: 29 mm/hr — ABNORMAL HIGH (ref 0–22)

## 2020-09-25 LAB — C-REACTIVE PROTEIN: CRP: 0.8 mg/dL (ref <1.0)

## 2020-09-25 MED ORDER — ONDANSETRON HCL 4 MG PO TABS: 4.0000 mg | ORAL_TABLET | Freq: Four times a day (QID) | ORAL | Status: DC | PRN

## 2020-09-25 MED ORDER — ONDANSETRON HCL 4 MG/2ML IJ SOLN: 4.0000 mg | Freq: Four times a day (QID) | INTRAMUSCULAR | Status: DC | PRN

## 2020-09-25 MED ORDER — RIVAROXABAN 20 MG PO TABS
20.0000 mg | ORAL_TABLET | Freq: Every day | ORAL | Status: DC
  Administered 2020-09-26: 17:00:00 20 mg via ORAL
  Filled 2020-09-25: qty 1

## 2020-09-25 MED ORDER — ACETAMINOPHEN 650 MG RE SUPP: 650.0000 mg | Freq: Four times a day (QID) | RECTAL | Status: DC | PRN

## 2020-09-25 MED ORDER — MULTIVITAMINS PO CAPS: 1.0000 | ORAL_CAPSULE | Freq: Every day | ORAL | Status: DC

## 2020-09-25 MED ORDER — VITAMIN D 25 MCG (1000 UNIT) PO TABS
1000.0000 [IU] | ORAL_TABLET | Freq: Every day | ORAL | Status: DC
  Administered 2020-09-26: 10:00:00 1000 [IU] via ORAL
  Filled 2020-09-25: qty 1

## 2020-09-25 MED ORDER — ADULT MULTIVITAMIN W/MINERALS CH
1.0000 | ORAL_TABLET | Freq: Every day | ORAL | Status: DC
  Administered 2020-09-26: 10:00:00 1 via ORAL
  Filled 2020-09-25: qty 1

## 2020-09-25 MED ORDER — ACETAMINOPHEN 325 MG PO TABS: 650.0000 mg | ORAL_TABLET | Freq: Four times a day (QID) | ORAL | Status: DC | PRN

## 2020-09-25 NOTE — H&P (Addendum)
History and Physical    Deborah Ortiz [id number] DOB: 05-Sep-1956 DOA: 09/25/2020  PCP: [name], MD  Patient coming from: Home  I have personally briefly reviewed patient's old medical records in [place]  Chief Complaint: Fatigue  HPI: Deborah Ortiz is a 64 y.o. 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  Pt had COVID-19 at end of Nov.  COVID symptoms improved but since that time pt experiencing intermittent fatigue, especially with exertion.  Today symptoms were constant and worsened and severe.  Prompting her to come in to ED.  No fevers, chills, CP, SOB, cough, abd pain, N/V/D, edema, dysuria.   ED Course: CXR neg, labs nl, trop neg x2, EKG just showing intermittent bigeminy.  EDP spoke with Dr. Radford Pax who recs obs for 2D ECHO in AM.   Review of Systems: As per HPI, otherwise all review of systems negative.  Past Medical History:  Diagnosis Date  . Long term (current) use of anticoagulants   . Mitral valve regurgitation   . Palpitations   . Paroxysmal atrial fibrillation (HCC)   . Pneumonia    HISTORY  . Sinusitis, acute     Past Surgical History:  Procedure Laterality Date  . CRYOABLATION     heart  . HERNIA REPAIR    . TONSILLECTOMY    . TUBAL LIGATION       reports that she quit smoking about 12 years ago. She has a 30.00 pack-year smoking history. She has never used smokeless tobacco. She reports current alcohol use. She reports that she does not use drugs.  Allergies  Allergen Reactions  . Doxycycline Hives, Other (See Comments) and Rash    Other reaction(s): Confusion (intolerance)  . Metoprolol   . Nitrofurantoin Itching, Other (See Comments) and Swelling    Family History  Problem Relation Age of Onset  . CVA Mother   . Stroke Mother   . Heart disease Mother   . Diabetes Mother   . Cancer Mother   . Cancer Father       Prior to Admission medications   Medication Sig Start Date End Date Taking? Authorizing Provider  Ascorbic Acid (VITAMIN C PO) Take 1 tablet by mouth daily.   Yes [provider]  BIOTIN PO Take by mouth daily.   Yes [provider]  Calcium-Vitamin D-Vitamin K (CALCIUM + D) [id number] MG-UNT-MCG CHEW See admin instructions.   Yes [provider]  Cholecalciferol (VITAMIN D-3) 1000 units CAPS Take 1 capsule by mouth daily.   Yes [provider]  COLLAGEN PO Take by mouth daily.   Yes [provider]  Glucosamine HCl (GLUCOSAMINE PO) Take 1 capsule by mouth daily.   Yes [provider]  Multiple Vitamin (MULTIVITAMIN) capsule Take 1 capsule by mouth daily.   Yes [provider]  Omega-3 Fatty Acids (FISH OIL PO) Take 1 capsule by mouth daily.    Yes [provider]  Probiotic Product (PROBIOTIC PO) Take 1 tablet by mouth daily.   Yes [provider]  XARELTO 20 MG TABS tablet TAKE 1 TABLET (20 MG TOTAL) BY MOUTH DAILY WITH SUPPER. Patient taking differently: Take 20 mg by mouth daily with supper. 07/01/20  Yes Constance Haw, MD    Physical Exam: Vitals:   09/25/20 2123 09/25/20 2130 09/25/20 2200 09/25/20 2230  BP: (!) 154/72 (!) 142/71 (!) 147/104 134/74  Pulse: 82  81 83   Resp: (!) 21 19 (!) 22 18  Temp:      TempSrc:      SpO2: 97% 99% 99% 97%    Constitutional: NAD, calm, comfortable Eyes: PERRL, lids and conjunctivae normal ENMT: Mucous membranes are moist. Posterior pharynx clear of any exudate or lesions.Normal dentition.  Neck: normal, supple, no masses, no thyromegaly Respiratory: clear to auscultation bilaterally, no wheezing, no crackles. Normal respiratory effort. No accessory muscle use.  Cardiovascular: Regular rate and rhythm, no murmurs / rubs / gallops. No extremity edema. 2+ pedal pulses. No carotid bruits.  Abdomen: no tenderness, no masses palpated. No hepatosplenomegaly.  Bowel sounds positive.  Musculoskeletal: no clubbing / cyanosis. No joint deformity upper and lower extremities. Good ROM, no contractures. Normal muscle tone.  Skin: no rashes, lesions, ulcers. No induration Neurologic: CN 2-12 grossly intact. Sensation intact, DTR normal. Strength 5/5 in all 4.  Psychiatric: Normal judgment and insight. Alert and oriented x 3. Normal mood.    Labs on Admission: I have personally reviewed following labs and imaging studies  CBC: Recent Labs  Lab 09/25/20 1837  WBC 7.6  NEUTROABS 4.6  HGB 13.3  HCT 41.1  MCV 95.6  PLT [id number]   Basic Metabolic Panel: Recent Labs  Lab 09/25/20 1837  NA 139  K 4.5  CL 105  CO2 25  GLUCOSE 106*  BUN 22  CREATININE 0.76  CALCIUM 9.0   GFR: CrCl cannot be calculated (Unknown ideal weight.). Liver Function Tests: Recent Labs  Lab 09/25/20 1837  AST 33  ALT 31  ALKPHOS 57  BILITOT 0.7  PROT 6.4*  ALBUMIN 3.4*   No results for input(s): LIPASE, AMYLASE in the last 168 hours. No results for input(s): AMMONIA in the last 168 hours. Coagulation Profile: No results for input(s): INR, PROTIME in the last 168 hours. Cardiac Enzymes: No results for input(s): CKTOTAL, CKMB, CKMBINDEX, TROPONINI in the last 168 hours. BNP (last 3 results) No results for input(s): PROBNP in the last 8760 hours. HbA1C: No results for input(s): HGBA1C in the last 72 hours. CBG: No results for input(s): GLUCAP in the last 168 hours. Lipid Profile: No results for input(s): CHOL, HDL, LDLCALC, TRIG, CHOLHDL, LDLDIRECT in the last 72 hours. Thyroid Function Tests: No results for input(s): TSH, T4TOTAL, FREET4, T3FREE, THYROIDAB in the last 72 hours. Anemia Panel: No results for input(s): VITAMINB12, FOLATE, FERRITIN, TIBC, IRON, RETICCTPCT in the last 72 hours. Urine analysis:    Component Value Date/Time   COLORURINE STRAW (A) 09/25/2020 1946   APPEARANCEUR CLEAR 09/25/2020 1946   LABSPEC 1.011 09/25/2020 1946   PHURINE 6.0  09/25/2020 1946   GLUCOSEU NEGATIVE 09/25/2020 1946   HGBUR NEGATIVE 09/25/2020 1946   BILIRUBINUR NEGATIVE 09/25/2020 1946   KETONESUR NEGATIVE 09/25/2020 1946   PROTEINUR NEGATIVE 09/25/2020 1946   NITRITE NEGATIVE 09/25/2020 1946   LEUKOCYTESUR NEGATIVE 09/25/2020 1946    Radiological Exams on Admission: DG Chest 2 View  Result Date: 09/25/2020 CLINICAL DATA:  64 year old female with fatigue. EXAM: CHEST - 2 VIEW COMPARISON:  None. FINDINGS: Background of emphysema. No focal consolidation, pleural effusion, pneumothorax. The cardiac silhouette is within limits. No acute osseous pathology. Degenerative changes of the spine. IMPRESSION: No active cardiopulmonary disease. Electronically Signed   By: Anner Crete M.D.   On: 09/25/2020 19:01    EKG: Independently reviewed. Intermittent bigeminy.  Assessment/Plan Principal Problem:   COVID-19 long hauler manifesting chronic fatigue Active Problems:   PAF (paroxysmal atrial fibrillation) ([place])  1. Fatigue - 1. DDx cardiogenic vs just COVID-19 "long haul" symptoms? 2. EDP spoke with cardiology: 1. Rec obs admit 2. And 2d echo in AM 3. Will check BNP 4. PE considered, but seems less likely given that she is on chronic Xarelto 5. Check TSH 2. PAF - 1. Cont Xarelto 2. Tele monitor  DVT prophylaxis: Xarelto Code Status: Full Family Communication: No family in room Disposition Plan: Home after work up [employer] called: EDP spoke with Dr. Radford Pax Admission status: Place in [place], [place] [facility]  How to contact the [facility] Attending or Consulting provider [place] or covering provider during after hours [place], for this patient?  1. Check the care team in [facility] and look for a) attending/consulting [facility] provider listed and b) the [facility] team listed 2. Log into [email or web address]  Amion Physician Scheduling and messaging for groups and whole hospitals  On call and physician scheduling software for group practices,  residents, hospitalists and other medical providers for call, clinic, rotation and shift schedules. On[employer] is a hospital-wide system for scheduling doctors and paging doctors on call. EasyPlot is for scientific plotting and data analysis.  [email or web address]  and use [place]'s universal password to access. If you do not have the password, please contact the hospital operator.  3. Locate the [facility] provider you are looking for under [facility] and page to a number that you can be directly reached. 4. If you still have difficulty reaching the provider, please page the [facility] (Director on Call) for the Hospitalists listed on amion for assistance.  09/25/2020, 10:45 PM

## 2020-09-25 NOTE — ED Triage Notes (Signed)
Pt reports extreme intermittent fatigue, recently recovering from [place]. Reps e.u, denies pain, just fatigue,. Pt a.o

## 2020-09-25 NOTE — ED Notes (Signed)
Lab to add on TSH and T4  

## 2020-09-25 NOTE — ED Provider Notes (Signed)
Deborah Ortiz   CSN: [id number] Arrival date & time: 09/25/20  1555     History Chief Complaint  Patient presents with  . Fatigue    Deborah Ortiz is a 64 y.o. female.  HPI      Deborah Ortiz is a 64 y.o. female, with a history of mitral valve regurg, A. fib, cardiac ablation, presenting to the ED with fatigue. States she had Covid at the end of November and beginning of December.  Since then, she has been experiencing intermittent fatigue, especially with exertion. Today, however, she endorses worsened with constant fatigue, "crushing and all consuming fatigue."  Denies fever/chills, chest pain, shortness of breath, cough, abdominal pain, dizziness, syncope, flank/back pain, urinary symptoms, N/V/C/D, lower extremity edema, or any other complaints.    Past Medical History:  Diagnosis Date  . Long term (current) use of anticoagulants   . Mitral valve regurgitation   . Palpitations   . Paroxysmal atrial fibrillation (HCC)   . Pneumonia    HISTORY  . Sinusitis, acute     Patient Active Problem List   Diagnosis Date Noted  . COVID-19 long hauler manifesting chronic fatigue 09/25/2020  . PAF (paroxysmal atrial fibrillation) ([place]) 09/25/2020  . Long term (current) use of anticoagulants   . Mitral valve regurgitation   . Referred otalgia of both ears 05/23/2019  . 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 Chronic sinusitis 08/21/2016  . Impacted cerumen of both ears 08/21/2016  . Pathological fracture due to osteoporosis with routine healing 06/29/2014  . Varicose veins of bilateral lower extremities with other complications 37/85/8850  . Female genital symptoms 01/19/2010  . Pain in joint 01/19/2010  . Personal history of tobacco use, presenting hazards to health 01/19/2010  . Allergic rhinitis 09/02/2008  . Contact dermatitis and other eczema due to other specified agent 09/02/2008  . Dysuria  09/02/2008  . Other and unspecified nonspecific immunological findings 09/01/2008  . 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 CRYOABLATION     heart  . HERNIA REPAIR    . TONSILLECTOMY    . TUBAL LIGATION       OB History   No obstetric history on file.     Family History  Problem Relation Age of Onset  . CVA Mother   . Stroke Mother   . Heart disease Mother   . Diabetes Mother   . Cancer Mother   . Cancer Father     Social History   Tobacco Use  . Smoking status: Former Smoker    Packs/day: 1.00    Years: 30.00    Pack years: 30.00    Quit date: 11/16/2007    Years since quitting: 12.8  . Smokeless tobacco: Never Used  Vaping Use  . Vaping Use: Never used  Substance Use Topics  . Alcohol use: Yes  . Drug use: Never    Home Medications Prior to Admission medications   Medication Sig Start Date End Date Taking? Authorizing Provider  Ascorbic Acid (VITAMIN C PO) Take 1 tablet by mouth daily.   Yes [provider]  BIOTIN PO Take by mouth daily.   Yes [provider]  Calcium-Vitamin D-Vitamin K (CALCIUM + D) [id number] MG-UNT-MCG CHEW See admin instructions.   Yes [provider]  Cholecalciferol (VITAMIN D-3) 1000 units CAPS Take 1 capsule by mouth daily.   Yes [provider]  COLLAGEN PO Take by mouth daily.  Yes [provider]  Glucosamine HCl (GLUCOSAMINE PO) Take 1 capsule by mouth daily.   Yes [provider]  Multiple Vitamin (MULTIVITAMIN) capsule Take 1 capsule by mouth daily.   Yes [provider]  Omega-3 Fatty Acids (FISH OIL PO) Take 1 capsule by mouth daily.    Yes [provider]  Probiotic Product (PROBIOTIC PO) Take 1 tablet by mouth daily.   Yes [provider]  XARELTO 20 MG TABS tablet TAKE 1 TABLET (20 MG TOTAL) BY MOUTH DAILY WITH SUPPER. Patient taking differently: Take 20 mg by mouth daily with supper. 07/01/20  Yes  Camnitz, Will Hassell Done, MD    Allergies    Doxycycline, Metoprolol, and Nitrofurantoin  Review of Systems   Review of Systems  Constitutional: Positive for fatigue. Negative for fever.  Respiratory: Negative for cough and shortness of breath.   Cardiovascular: Negative for chest pain and leg swelling.  Gastrointestinal: Negative for abdominal pain, diarrhea, nausea and vomiting.  Genitourinary: Negative for dysuria and hematuria.  Musculoskeletal: Negative for back pain.  Neurological: Negative for dizziness and syncope.  All other systems reviewed and are negative.   Physical Exam Updated Vital Signs BP 135/66   Pulse 77   Temp 98.9 F (37.2 C) (Oral)   Resp 20   SpO2 100%   Physical Exam Vitals and nursing Ortiz reviewed.  Constitutional:      General: She is not in acute distress.    Appearance: She is well-developed. She is not diaphoretic.  HENT:     Head: Normocephalic and atraumatic.     Mouth/Throat:     Mouth: Mucous membranes are moist.     Pharynx: Oropharynx is clear.  Eyes:     Conjunctiva/sclera: Conjunctivae normal.  Cardiovascular:     Rate and Rhythm: Normal rate and regular rhythm.     Pulses: Normal pulses.          Radial pulses are 2+ on the right side and 2+ on the left side.       Posterior tibial pulses are 2+ on the right side and 2+ on the left side.     Heart sounds: Normal heart sounds.     Comments: Tactile temperature in the extremities appropriate and equal bilaterally. Pulmonary:     Effort: Pulmonary effort is normal. No respiratory distress.     Breath sounds: Normal breath sounds.  Abdominal:     Palpations: Abdomen is soft.     Tenderness: There is no abdominal tenderness. There is no guarding.  Musculoskeletal:     Cervical back: Neck supple.     Right lower leg: No edema.     Left lower leg: No edema.  Skin:    General: Skin is warm and dry.  Neurological:     Mental Status: She is alert and oriented to person, place, and  time.     Comments: No noted acute cognitive deficit. Sensation grossly intact to light touch in the extremities.   Grip strengths equal bilaterally.   Strength 5/5 in all extremities.  No gait disturbance.  Coordination intact.  Cranial nerves III-XII grossly intact.  Handles oral secretions without noted difficulty.  No noted phonation or speech deficit. No facial droop.   Psychiatric:        Mood and Affect: Mood and affect normal.        Speech: Speech normal.        Behavior: Behavior normal.     ED Results / Procedures /  Treatments   Labs (all labs ordered are listed, but only abnormal results are displayed) Labs Reviewed  COMPREHENSIVE METABOLIC PANEL - Abnormal; Notable for the following components:      Result Value   Glucose, Bld 106 (*)    Total Protein 6.4 (*)    Albumin 3.4 (*)    All other components within normal limits  SEDIMENTATION RATE - Abnormal; Notable for the following components:   Sed Rate 29 (*)    All other components within normal limits  URINALYSIS, ROUTINE W REFLEX MICROSCOPIC - Abnormal; Notable for the following components:   Color, Urine STRAW (*)    All other components within normal limits  CBC WITH DIFFERENTIAL/PLATELET  C-REACTIVE PROTEIN  HIV ANTIBODY (ROUTINE TESTING W REFLEX)  CBC  BASIC METABOLIC PANEL  BRAIN NATRIURETIC PEPTIDE  TSH  T4, FREE  TROPONIN I (HIGH SENSITIVITY)  TROPONIN I (HIGH SENSITIVITY)    EKG EKG Interpretation  Date/Time:  Saturday September 25 2020 18:57:15 EST Ventricular Rate:  82 PR Interval:  160 QRS Duration: 78 QT Interval:  401 QTC Calculation: 469 R Axis:   94 Text Interpretation: Sinus rhythm Ventricular bigeminy Consider right atrial enlargement Right axis deviation increased ectopy from prior today Confirmed by Aletta Edouard [phone]) on 09/25/2020 7:07:11 PM   Radiology DG Chest 2 View  Result Date: 09/25/2020 CLINICAL DATA:  64 year old female with fatigue. EXAM: CHEST - 2 VIEW  COMPARISON:  None. FINDINGS: Background of emphysema. No focal consolidation, pleural effusion, pneumothorax. The cardiac silhouette is within limits. No acute osseous pathology. Degenerative changes of the spine. IMPRESSION: No active cardiopulmonary disease. Electronically Signed   By: Anner Crete M.D.   On: 09/25/2020 19:01    Procedures Procedures (including critical care time)  Medications Ordered in ED Medications  acetaminophen (TYLENOL) tablet 650 mg (has no administration in time range)    Or  acetaminophen (TYLENOL) suppository 650 mg (has no administration in time range)  ondansetron (ZOFRAN) tablet 4 mg (has no administration in time range)    Or  ondansetron (ZOFRAN) injection 4 mg (has no administration in time range)  cholecalciferol (VITAMIN D3) tablet 1,000 Units (has no administration in time range)  rivaroxaban (XARELTO) tablet 20 mg (has no administration in time range)  multivitamin with minerals tablet 1 tablet (has no administration in time range)    ED Course  I have reviewed the triage vital signs and the nursing notes.  Pertinent labs & imaging results that were available during my care of the patient were reviewed by me and considered in my medical decision making (see chart for details).  Clinical Course as of 09/26/20 0020  Sat Sep 25, 5709  23 64 year old female here with fatigue and increased palpitations going over the course of a few weeks.  The fatigue actually started back when she had Covid in the summer but seem to be getting better.  Now things have started to get worse again.  She is having frequent PVCs on the monitor.  Blood pressure has been good.  Oxygen 100%.  Getting some screening labs troponins.  PVCs have been there in the past and she follows with Dr. Curt Bears.  Will definitely need follow-up. [MB]  2117 Paged cardiology fellow, listed as Radford Pax [phone]). [SJ]  2155 Spoke with Dr. Radford Pax, cardiology.  Requests that we admit the  patient for observation.  Echocardiogram in the morning. Requests we add thyroid labs to the patient's work-up. [SJ]  2238 Spoke with Dr. Alcario Drought, hospitalist.  Rozetta Nunnery  to admit the patient. [SJ]    Clinical Course User Index [MB] Hayden Rasmussen, MD [SJ] Layla Maw   MDM Rules/Calculators/A&P                          Patient presents with worsened fatigue, generalized weakness, malaise beginning today. Patient is nontoxic appearing, afebrile, not tachycardic, not tachypneic, not hypotensive, maintains excellent SPO2 on room air, and is in no apparent distress.   I have reviewed the patient's chart to obtain more information.   I reviewed and interpreted the patient's labs and radiological studies. ESR mildly elevated, however, CRP normal.  Other lab work overall unremarkable. Discussed additional imaging available to me, such as CT PE study, though based on objective findings and even patient symptoms, my suspicion for PE is quite low.  Findings and plan of care discussed with attending physician, Ronnald Ramp, MD. Dr. Melina Copa personally evaluated and examined this patient.  Vitals:   09/25/20 2200 09/25/20 2230 09/25/20 2300 09/25/20 2330  BP: (!) 147/104 134/74 (!) 152/34 (!) 151/69  Pulse: 83  77 70  Resp: (!) 22 18 19 19   Temp:      TempSrc:      SpO2: 99% 97% 99% 96%     Final Clinical Impression(s) / ED Diagnoses Final diagnoses:  Generalized weakness    Rx / DC Orders ED Discharge Orders    None       Layla Maw 09/26/20 0020    Hayden Rasmussen, MD 09/26/20 [phone]

## 2020-09-25 NOTE — ED Notes (Signed)
Patient transported to X-ray 

## 2020-09-26 ENCOUNTER — Observation Stay (HOSPITAL_BASED_OUTPATIENT_CLINIC_OR_DEPARTMENT_OTHER): Payer: Medicare Other

## 2020-09-26 DIAGNOSIS — J432 Centrilobular emphysema: Secondary | ICD-10-CM | POA: Diagnosis not present

## 2020-09-26 DIAGNOSIS — I480 Paroxysmal atrial fibrillation: Secondary | ICD-10-CM

## 2020-09-26 DIAGNOSIS — I361 Nonrheumatic tricuspid (valve) insufficiency: Secondary | ICD-10-CM | POA: Diagnosis not present

## 2020-09-26 DIAGNOSIS — I340 Nonrheumatic mitral (valve) insufficiency: Secondary | ICD-10-CM

## 2020-09-26 DIAGNOSIS — R5382 Chronic fatigue, unspecified: Secondary | ICD-10-CM | POA: Diagnosis not present

## 2020-09-26 DIAGNOSIS — E663 Overweight: Secondary | ICD-10-CM | POA: Diagnosis not present

## 2020-09-26 DIAGNOSIS — R5383 Other fatigue: Secondary | ICD-10-CM | POA: Diagnosis not present

## 2020-09-26 LAB — ECHOCARDIOGRAM COMPLETE
AR max vel: 2.11 cm2
AV Area VTI: 1.98 cm2
AV Area mean vel: 2.01 cm2
AV Mean grad: 4 mmHg
AV Peak grad: 7.5 mmHg
Ao pk vel: 1.37 m/s
Area-P 1/2: 2.87 cm2
Height: 64 in
S' Lateral: 2.6 cm
Weight: 2758.4 oz

## 2020-09-26 LAB — BASIC METABOLIC PANEL
Anion gap: 8 (ref 5–15)
BUN: 20 mg/dL (ref 8–23)
CO2: 25 mmol/L (ref 22–32)
Calcium: 8.8 mg/dL — ABNORMAL LOW (ref 8.9–10.3)
Chloride: 107 mmol/L (ref 98–111)
Creatinine, Ser: 0.62 mg/dL (ref 0.44–1.00)
GFR, Estimated: >60 mL/min (ref >60)
Glucose, Bld: 115 mg/dL — ABNORMAL HIGH (ref 70–99)
Potassium: 3.6 mmol/L (ref 3.5–5.1)
Sodium: 140 mmol/L (ref 135–145)

## 2020-09-26 LAB — BLOOD GAS, ARTERIAL
Acid-Base Excess: 1.9 mmol/L (ref 0.0–2.0)
Bicarbonate: 25.5 mmol/L (ref 20.0–28.0)
Drawn by: 12971
FIO2: 21
O2 Saturation: 98.1 %
Patient temperature: 36.9
pCO2 arterial: 36.7 mmHg (ref 32.0–48.0)
pH, Arterial: 7.455 — ABNORMAL HIGH (ref 7.350–7.450)
pO2, Arterial: 102 mmHg (ref 83.0–108.0)

## 2020-09-26 LAB — MRSA PCR SCREENING: MRSA by PCR: NEGATIVE

## 2020-09-26 LAB — CBC
HCT: 37.3 % (ref 36.0–46.0)
Hemoglobin: 12.2 g/dL (ref 12.0–15.0)
MCH: 31.3 pg (ref 26.0–34.0)
MCHC: 32.7 g/dL (ref 30.0–36.0)
MCV: 95.6 fL (ref 80.0–100.0)
Platelets: 151 10*3/uL (ref 150–400)
RBC: 3.9 MIL/uL (ref 3.87–5.11)
RDW: 13.2 % (ref 11.5–15.5)
WBC: 6 10*3/uL (ref 4.0–10.5)
nRBC: 0 % (ref 0.0–0.2)

## 2020-09-26 LAB — T4, FREE: Free T4: 0.83 ng/dL (ref 0.61–1.12)

## 2020-09-26 LAB — TSH: TSH: 2.386 u[IU]/mL (ref 0.350–4.500)

## 2020-09-26 LAB — BRAIN NATRIURETIC PEPTIDE: B Natriuretic Peptide: 128.6 pg/mL — ABNORMAL HIGH (ref 0.0–100.0)

## 2020-09-26 LAB — HIV ANTIBODY (ROUTINE TESTING W REFLEX): HIV Screen 4th Generation wRfx: NONREACTIVE

## 2020-09-26 MED ORDER — FUROSEMIDE 10 MG/ML IJ SOLN
20.0000 mg | Freq: Once | INTRAMUSCULAR | Status: AC
  Administered 2020-09-26: 14:00:00 20 mg via INTRAVENOUS
  Filled 2020-09-26: qty 2

## 2020-09-26 MED ORDER — ALBUTEROL SULFATE HFA 108 (90 BASE) MCG/ACT IN AERS: 2.0000 | INHALATION_SPRAY | Freq: Four times a day (QID) | RESPIRATORY_TRACT | 2 refills | Status: DC | PRN

## 2020-09-26 NOTE — Progress Notes (Signed)
Discharged home by herself claiming to  take Deborah Ortiz ride in going home. Discharge instructions given to pt. Belongings taken home.

## 2020-09-26 NOTE — Progress Notes (Signed)
Pt refused to take lasix now until echo is done today. Spoke with EKG tech who is going to notify echo tech. Awaiting return call.

## 2020-09-26 NOTE — Discharge Summary (Signed)
Discharge Summary  Deborah Ortiz [id number] DOB: [birth year]  PCP: [name], MD  Admit date: 09/25/2020 Discharge date: 09/26/2020  Time spent: 45 minutes  Recommendations for Outpatient Follow-up:  1. New medication: Albuterol as needed 2. Referring patient to home health for PT, OT and aide 3. Patient may end up needing further referral to pulmonary rehab depending on how she does with physical therapy 4. Follow-up with her PCP in 2 weeks-if she is no better, could consider a beta-blocker.  See below.  Discharge Diagnoses:  Active Hospital Problems   Diagnosis Date Noted  . COVID-19 long hauler manifesting chronic fatigue 09/25/2020  . PAF (paroxysmal atrial fibrillation) ([place]) 09/25/2020    Resolved Hospital Problems  No resolved problems to display.    Discharge Condition: Unchanged, but stable.  Being discharged home  Diet recommendation: Regular  Vitals:   09/26/20 1159 09/26/20 1600  BP:    Pulse:    Resp:    Temp: 98.7 F (37.1 C) 98.2 F (36.8 C)  SpO2:      History of present illness:  64 year old female past medical history of paroxysmal A. fib on anticoagulation who contracted Covid approximately 4 to 5 weeks ago.  She was not hospitalized and recovered from home.  She was treated with p.o. steroids.  Approximately 2 weeks ago, she started noticing that she has been getting very short of breath with any type of activity and she feels very lightheaded.  She notes frequent PVCs.  Symptoms continue to persist and she came to the emergency room.  Chest x-ray and labs negative.  EKG noted some intermittent bigeminy.  Given her symptoms it was felt best that she come in for further observation.  Echocardiogram was ordered.  Hospital Course:  Principal Problem:   COVID-19 long hauler manifesting chronic fatigue: And evaluation of the patient, echocardiogram was done and found to completely unremarkable with normal ejection fraction, no signs of  valvular dysfunction or structural abnormality.  Patient also had an ABG done which noted no signs of hypoxia or hypercarbia.  I personally ambulated the patient in the hallway and noted while she did not have any oxygen saturation drops, she was quite tachypneic and borderline tachycardic and at times, needed to stop to catch her breath and rest.  I suspect that this is residual from Covid causing her to have extreme deconditioning or even minor activities lead to tachypnea and tachycardia.  She has no underlying structural abnormalities such as heart failure and she does not become truly hypoxic even though she feels like she is going to, therefore this looks more like Covid than say emphysema.  Since she has good oxygen diffusing, no need to put her on a course of steroids.  I did order a as needed inhaler more for her long-term emphysema.  See below.  She should recover, but it will take some time.  In the meantime, we will try a course of home health PT and OT.  If her symptoms persist, could consider referring her to pulmonary rehab for improved strength training.  Could also consider putting her on a very low-dose beta-blocker as even with mild activity if she runs in the 90s 200s easily with even getting up to go to the bathroom.  Her prior pressure was a little bit higher during her hospital stay so I do not think that she would become orthostatic.  Nevertheless, this may be better closely monitored and could be determined by her PCP and only if the  other treatments of home physical therapy are not working Active Problems:   PAF (paroxysmal atrial fibrillation) ([place]): No signs of atrial fibrillation during her hospitalization.  Continue her Eliquis.  Given tachycardia, she is in sinus rhythm.  Emphysema: Patient noted to have signs of emphysema on her chest x-ray as well as a CT done back in March.  She was a longtime smoker, but quit about 4 years ago.  Prior to Covid, she was doing just fine and I do  not think that her current symptoms are resulting from this.  Overweight: Patient is criteria BMI greater than 25.  She tells me this is the heaviest she has been ever.  She is almost considered obese.  Likely contributing factor to her severe deconditioning   Procedures:  Echocardiogram done 12/26: No evidence of valvular dysfunction, no diastolic dysfunction.  Ejection fraction preserved  Consultations:  None  Discharge Exam: BP 132/75 (BP Location: Right Arm)   Pulse 61   Temp 98.2 F (36.8 C) (Oral)   Resp 17   Ht 5\' 4"  (1.626 m)   Wt 78.2 kg   SpO2 97%   BMI 29.59 kg/m   General: Alert and oriented x3, no acute distress Cardiovascular: Regular rate and rhythm, S1-S2 Respiratory: Clear to auscultation bilaterally  Discharge Instructions You were cared for by a hospitalist during your hospital stay. If you have any questions about your discharge medications or the care you received while you were in the hospital after you are discharged, you can call the unit and asked to speak with the hospitalist on call if the hospitalist that took care of you is not available. Once you are discharged, your primary care physician will handle any further medical issues. Please note that NO REFILLS for any discharge medications will be authorized once you are discharged, as it is imperative that you return to your primary care physician (or establish a relationship with a primary care physician if you do not have one) for your aftercare needs so that they can reassess your need for medications and monitor your lab values.  Discharge Instructions    Diet - low sodium heart healthy   Complete by: As directed    Increase activity slowly   Complete by: As directed      Allergies as of 09/26/2020      Reactions   Doxycycline Hives, Other (See Comments), Rash   Other reaction(s): Confusion (intolerance)   Metoprolol    Nitrofurantoin Itching, Other (See Comments), Swelling      Medication  List    TAKE these medications   albuterol 108 (90 Base) MCG/ACT inhaler Commonly known as: VENTOLIN HFA Inhale 2 puffs into the lungs every 6 (six) hours as needed for wheezing or shortness of breath.   BIOTIN PO Take by mouth daily.   Calcium + D [phone] MG-UNT-MCG Chew Generic drug: Calcium-Vitamin D-Vitamin K See admin instructions.   COLLAGEN PO Take by mouth daily.   FISH OIL PO Take 1 capsule by mouth daily.   GLUCOSAMINE PO Take 1 capsule by mouth daily.   multivitamin capsule Take 1 capsule by mouth daily.   PROBIOTIC PO Take 1 tablet by mouth daily.   VITAMIN C PO Take 1 tablet by mouth daily.   Vitamin D-3 25 MCG (1000 UT) Caps Take 1 capsule by mouth daily.   Xarelto 20 MG Tabs tablet Generic drug: rivaroxaban TAKE 1 TABLET (20 MG TOTAL) BY MOUTH DAILY WITH SUPPER. What changed: See the new instructions.  Allergies  Allergen Reactions  . Doxycycline Hives, Other (See Comments) and Rash    Other reaction(s): Confusion (intolerance)  . Metoprolol   . Nitrofurantoin Itching, Other (See Comments) and Swelling      The results of significant diagnostics from this hospitalization (including imaging, microbiology, ancillary and laboratory) are listed below for reference.    Significant Diagnostic Studies: DG Chest 2 View  Result Date: 09/25/2020 CLINICAL DATA:  64 year old female with fatigue. EXAM: CHEST - 2 VIEW COMPARISON:  None. FINDINGS: Background of emphysema. No focal consolidation, pleural effusion, pneumothorax. The cardiac silhouette is within limits. No acute osseous pathology. Degenerative changes of the spine. IMPRESSION: No active cardiopulmonary disease. Electronically Signed   By: Anner Crete M.D.   On: 09/25/2020 19:01   ECHOCARDIOGRAM COMPLETE  Result Date: 09/26/2020    ECHOCARDIOGRAM REPORT   Patient Name:   Deborah Ortiz Date of Exam: 09/26/2020 Medical Rec #:  [id number]           Height:       64.0 in  Accession #:    [id number]          Weight:       172.4 lb Date of Birth:  01-06-1956           BSA:          1.837 m Patient Age:    25 years            BP:           132/75 mmHg Patient Gender: F                   HR:           91 bpm. Exam Location:  Inpatient Procedure: 2D Echo, Cardiac Doppler and Color Doppler Indications:    Fatigue  History:        Patient has prior history of Echocardiogram examinations, most                 recent 01/09/2018. Arrythmias:Atrial Fibrillation. S/p                 cryoablation per patient. History of COVID-19.  Sonographer:    Clayton Lefort RDCS (AE) Referring Phys: [place]  1. Left ventricular ejection fraction, by estimation, is 65 to 70%. The left ventricle has normal function. The left ventricle has no regional wall motion abnormalities. Left ventricular diastolic parameters were normal.  2. Right ventricular systolic function is normal. The right ventricular size is normal. There is normal pulmonary artery systolic pressure.  3. The mitral valve is normal in structure. Mild mitral valve regurgitation. No evidence of mitral stenosis.  4. The aortic valve is normal in structure. Aortic valve regurgitation is not visualized. No aortic stenosis is present.  5. The inferior vena cava is normal in size with greater than 50% respiratory variability, suggesting right atrial pressure of 3 mmHg. FINDINGS  Left Ventricle: Left ventricular ejection fraction, by estimation, is 65 to 70%. The left ventricle has normal function. The left ventricle has no regional wall motion abnormalities. The left ventricular internal cavity size was normal in size. There is  no left ventricular hypertrophy. Left ventricular diastolic parameters were normal. Right Ventricle: The right ventricular size is normal. No increase in right ventricular wall thickness. Right ventricular systolic function is normal. There is normal pulmonary artery systolic pressure. Left Atrium: Left atrial  size was normal in size. Right Atrium: Right atrial size  was normal in size. Pericardium: There is no evidence of pericardial effusion. Mitral Valve: The mitral valve is normal in structure. Mild mitral valve regurgitation. No evidence of mitral valve stenosis. MV peak gradient, 3.2 mmHg. The mean mitral valve gradient is 1.0 mmHg. Tricuspid Valve: The tricuspid valve is normal in structure. Tricuspid valve regurgitation is mild . No evidence of tricuspid stenosis. Aortic Valve: The aortic valve is normal in structure. Aortic valve regurgitation is not visualized. No aortic stenosis is present. Aortic valve mean gradient measures 4.0 mmHg. Aortic valve peak gradient measures 7.5 mmHg. Aortic valve area, by VTI measures 1.98 cm. Pulmonic Valve: The pulmonic valve was normal in structure. Pulmonic valve regurgitation is not visualized. No evidence of pulmonic stenosis. Aorta: The aortic root is normal in size and structure. Venous: The inferior vena cava is normal in size with greater than 50% respiratory variability, suggesting right atrial pressure of 3 mmHg. IAS/Shunts: No atrial level shunt detected by color flow Doppler.  LEFT VENTRICLE PLAX 2D LVIDd:         4.20 cm  Diastology LVIDs:         2.60 cm  LV e' medial:    9.36 cm/s LV PW:         1.20 cm  LV E/e' medial:  8.9 LV IVS:        1.10 cm  LV e' lateral:   7.72 cm/s LVOT diam:     1.90 cm  LV E/e' lateral: 10.7 LV SV:         54 LV SV Index:   29 LVOT Area:     2.84 cm  RIGHT VENTRICLE             IVC RV Basal diam:  2.60 cm     IVC diam: 2.50 cm RV S prime:     17.10 cm/s TAPSE (M-mode): 1.5 cm LEFT ATRIUM             Index       RIGHT ATRIUM           Index LA diam:        3.70 cm 2.01 cm/m  RA Area:     12.30 cm LA Vol (A2C):   53.6 ml 29.18 ml/m RA Volume:   26.00 ml  14.16 ml/m LA Vol (A4C):   36.3 ml 19.76 ml/m LA Biplane Vol: 45.8 ml 24.94 ml/m  AORTIC VALVE AV Area (Vmax):    2.11 cm AV Area (Vmean):   2.01 cm AV Area (VTI):     1.98 cm  AV Vmax:           137.00 cm/s AV Vmean:          96.600 cm/s AV VTI:            0.271 m AV Peak Grad:      7.5 mmHg AV Mean Grad:      4.0 mmHg LVOT Vmax:         102.00 cm/s LVOT Vmean:        68.400 cm/s LVOT VTI:          0.189 m LVOT/AV VTI ratio: 0.70  AORTA Ao Root diam: 3.00 cm Ao Asc diam:  3.00 cm MITRAL VALVE MV Area (PHT): 2.87 cm    SHUNTS MV Peak grad:  3.2 mmHg    Systemic VTI:  0.19 m MV Mean grad:  1.0 mmHg    Systemic Diam: 1.90 cm MV Vmax:  0.90 m/s MV Vmean:      51.2 cm/s MV Decel Time: 264 msec MV E velocity: 82.90 cm/s MV A velocity: 84.90 cm/s MV E/A ratio:  0.98 Ena Dawley MD Electronically signed by Ena Dawley MD Signature Date/Time: 09/26/2020/1:45:16 PM    Final     Microbiology: Recent Results (from the past 240 hour(s))  MRSA PCR Screening     Status: None   Collection Time: 09/26/20  4:50 AM   Specimen: Nasal Mucosa; Nasopharyngeal  Result Value Ref Range Status   MRSA by PCR NEGATIVE NEGATIVE Final    Comment:        The GeneXpert MRSA Assay (FDA approved for NASAL specimens only), is one component of a comprehensive MRSA colonization surveillance program. It is not intended to diagnose MRSA infection nor to guide or monitor treatment for MRSA infections. Performed at [facility], [place] [address] [place], [place] [address]      Labs: Basic Metabolic Panel: Recent Labs  Lab 09/25/20 1837 09/26/20 0430  NA 139 140  K 4.5 3.6  CL 105 107  CO2 25 25  GLUCOSE 106* 115*  BUN 22 20  CREATININE 0.76 0.62  CALCIUM 9.0 8.8*   Liver Function Tests: Recent Labs  Lab 09/25/20 1837  AST 33  ALT 31  ALKPHOS 57  BILITOT 0.7  PROT 6.4*  ALBUMIN 3.4*   No results for input(s): LIPASE, AMYLASE in the last 168 hours. No results for input(s): AMMONIA in the last 168 hours. CBC: Recent Labs  Lab 09/25/20 1837 09/26/20 0430  WBC 7.6 6.0  NEUTROABS 4.6  --   HGB 13.3 12.2  HCT 41.1 37.3  MCV 95.6 95.6  PLT 182 151    Cardiac Enzymes: No results for input(s): CKTOTAL, CKMB, CKMBINDEX, TROPONINI in the last 168 hours. BNP: BNP (last 3 results) Recent Labs    09/25/20 2314  BNP 128.6*    ProBNP (last 3 results) No results for input(s): PROBNP in the last 8760 hours.  CBG: No results for input(s): GLUCAP in the last 168 hours.     Signed:  Annita Brod, MD [facility] 09/26/2020, 4:51 PM

## 2020-09-26 NOTE — Progress Notes (Signed)
  Echocardiogram 2D Echocardiogram has been performed.  Deborah Ortiz 09/26/2020, 1:31 PM

## 2020-09-26 NOTE — Progress Notes (Signed)
Complained of sudden headache, Bp-143/90 sat-100% 0n room air, hr- 90 resp-16.  Made comfortable on bed. After few min. headache subsided. Continue to monitor.

## 2020-11-12 ENCOUNTER — Ambulatory Visit (INDEPENDENT_AMBULATORY_CARE_PROVIDER_SITE_OTHER): Payer: Medicare Other | Admitting: Cardiology

## 2020-11-12 ENCOUNTER — Other Ambulatory Visit: Payer: Self-pay

## 2020-11-12 ENCOUNTER — Encounter: Payer: Self-pay | Admitting: Cardiology

## 2020-11-12 VITALS — BP 152/88 | HR 86 | Ht 64.0 in | Wt 173.4 lb

## 2020-11-12 DIAGNOSIS — I480 Paroxysmal atrial fibrillation: Secondary | ICD-10-CM

## 2020-11-12 NOTE — Progress Notes (Signed)
Electrophysiology Office Note   Date:  11/12/2020   ID:  Gracianna, Vink [birth year], MRN [id number]  PCP:  [name], MD  Cardiologist:   Primary Electrophysiologist:  Naileah Karg Meredith Leeds, MD    No chief complaint on file.    History of Present Illness: Deborah Ortiz is a 65 y.o. female who is being seen today for the evaluation of atrial fibrillation at the request of [name], MD. Presenting today for electrophysiology evaluation.    She has a history sniffing for paroxysmal atrial fibrillation, mitral regurgitation, and PVCs.  November 2018 she started feeling poorly after having a nail infection.  January 2019 she was noted to have atrial fibrillation and was started on Xarelto and sotalol.  She had a cardioversion.  She had recurrent atrial fibrillation underwent ablation April 2019 with cryoablation.  Sotalol was stopped due to dizziness.  She was noted to then have PVCs with a burden of 13%.  She was started on Toprol-XL but had severe symptoms such as brain fog.  Today, denies symptoms of palpitations, chest pain, shortness of breath, orthopnea, PND, lower extremity edema, claudication, dizziness, presyncope, syncope, bleeding, or neurologic sequela. The patient is tolerating medications without difficulties.  Unfortunately, she had Covid at the end of last year.  She now has persistent shortness of breath, weakness, and fatigue.  Fortunately she did not go into atrial fibrillation.  She did require 1 night in the hospital.  Past Medical History:  Diagnosis Date  . Long term (current) use of anticoagulants   . Mitral valve regurgitation   . Palpitations   . Paroxysmal atrial fibrillation ([facility])   . Pneumonia    HISTORY  . Sinusitis, acute    Past Surgical History:  Procedure Laterality Date  . CRYOABLATION     heart  . HERNIA REPAIR    . TONSILLECTOMY    . TUBAL LIGATION       Current Outpatient Medications  Medication Sig Dispense Refill   . albuterol (VENTOLIN HFA) 108 (90 Base) MCG/ACT inhaler Inhale 2 puffs into the lungs every 6 (six) hours as needed for wheezing or shortness of breath. 8 g 2  . Ascorbic Acid (VITAMIN C PO) Take 1 tablet by mouth daily.    Marland Kitchen BIOTIN PO Take by mouth daily.    . Calcium-Vitamin D-Vitamin K (CALCIUM + D) [phone] MG-UNT-MCG CHEW See admin instructions.    . Cholecalciferol (VITAMIN D-3) 1000 units CAPS Take 1 capsule by mouth daily.    . COLLAGEN PO Take by mouth daily.    . Glucosamine HCl (GLUCOSAMINE PO) Take 1 capsule by mouth daily.    . Multiple Vitamin (MULTIVITAMIN) capsule Take 1 capsule by mouth daily.    . Omega-3 Fatty Acids (FISH OIL PO) Take 1 capsule by mouth daily.     . Probiotic Product (PROBIOTIC PO) Take 1 tablet by mouth daily.    Alveda Reasons 20 MG TABS tablet TAKE 1 TABLET (20 MG TOTAL) BY MOUTH DAILY WITH SUPPER. 30 tablet 10   No current facility-administered medications for this visit.    Allergies:   Doxycycline, Metoprolol, and Nitrofurantoin   Social History:  The patient  reports that she quit smoking about 13 years ago. She has a 30.00 pack-year smoking history. She has never used smokeless tobacco. She reports current alcohol use. She reports that she does not use drugs.   Family History:  The patient's family history includes CVA in her mother; Cancer in her father  and mother; Diabetes in her mother; Heart disease in her mother; Stroke in her mother.   ROS:  Please see the history of present illness.   Otherwise, review of systems is positive for none.   All other systems are reviewed and negative.   PHYSICAL EXAM: VS:  BP (!) 152/88   Pulse 86   Ht 5\' 4"  (1.626 m)   Wt 173 lb 6.4 oz (78.7 kg)   SpO2 96%   BMI 29.76 kg/m  , BMI Body mass index is 29.76 kg/m. GEN: Well nourished, well developed, in no acute distress  HEENT: normal  Neck: no JVD, carotid bruits, or masses Cardiac: RRR; no murmurs, rubs, or gallops,no edema  Respiratory:  clear to  auscultation bilaterally, normal work of breathing GI: soft, nontender, nondistended, + BS MS: no deformity or atrophy  Skin: warm and dry Neuro:  Strength and sensation are intact Psych: euthymic mood, full affect  EKG:  EKG is ordered today. Personal review of the ekg ordered shows sinus rhythm, PVC may  Recent Labs: 06/09/2020: Magnesium 2.1 09/25/2020: ALT 31; B Natriuretic Peptide 128.6; TSH 2.386 09/26/2020: BUN 20; Creatinine, Ser 0.62; Hemoglobin 12.2; Platelets 151; Potassium 3.6; Sodium 140    Lipid Panel  No results found for: CHOL, TRIG, HDL, CHOLHDL, VLDL, LDLCALC, LDLDIRECT   Wt Readings from Last 3 Encounters:  11/12/20 173 lb 6.4 oz (78.7 kg)  09/26/20 172 lb 6.4 oz (78.2 kg)  06/15/20 171 lb (77.6 kg)      Other studies Reviewed: Additional studies/ records that were reviewed today include: TTE 09/26/2020  Review of the above records today demonstrates:  1. Left ventricular ejection fraction, by estimation, is 65 to 70%. The  left ventricle has normal function. The left ventricle has no regional  wall motion abnormalities. Left ventricular diastolic parameters were  normal.  2. Right ventricular systolic function is normal. The right ventricular  size is normal. There is normal pulmonary artery systolic pressure.  3. The mitral valve is normal in structure. Mild mitral valve  regurgitation. No evidence of mitral stenosis.  4. The aortic valve is normal in structure. Aortic valve regurgitation is  not visualized. No aortic stenosis is present.  5. The inferior vena cava is normal in size with greater than 50%  respiratory variability, suggesting right atrial pressure of 3 mmHg.   Cardiac CT: LAD eccentric calcified plaque less than 50% Circumflex no significant stenosis RCA motion artifact throughout precludes accurate assessment Left main no significant narrowing  Cardiac monitor 05/04/2020 personally reviewed Max 187 bpm 07:27pm, 07/18 Min 52 bpm  05:22am, 07/18 Avg 73 bpm 1% PACs 13.4% PVCs 26 VT runs, longest 5 beats 9 SVT runs noted, longest 27 seconds at 116 bpm Triggered events associated with sinus rhythm, PVCs, SVT   ASSESSMENT AND PLAN:  1.  Paroxysmal atrial fibrillation: Currently on Xarelto.  Status post cryoablation.  CHA2DS2-VASc of 2.  She is remained in sinus rhythm.  She has had no further episodes of atrial fibrillation.  She was quite sick with Covid and did not have atrial fibrillation at that time.  2.  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  She wore a cardiac monitor that showed a 13% burden.  Currently asymptomatic   Current medicines are reviewed at length with the patient today.   The patient does not have concerns regarding her medicines.  The following changes were made today: None  Labs/ tests ordered today include:  No orders of the defined  types were placed in this encounter.    Disposition:   FU with Nana Vastine 6 months  Signed, [place]rick [place] Meredith Leeds, MD  11/12/2020 2:32 PM     [place] [address] [place] [address] [phone] (office) [phone] (fax)

## 2020-11-17 NOTE — Addendum Note (Signed)
Addended by: Rose Phi on: 11/17/2020 02:00 PM   Modules accepted: Orders

## 2020-12-21 ENCOUNTER — Other Ambulatory Visit: Payer: Self-pay | Admitting: Internal Medicine

## 2020-12-21 DIAGNOSIS — Z1231 Encounter for screening mammogram for malignant neoplasm of breast: Secondary | ICD-10-CM

## 2020-12-29 NOTE — Telephone Encounter (Signed)
Called pt to discuss.  Apologized for the delay in getting back with her. She reports that PCP would like her to discuss statin medication w/ cardiology. Pt advised to complete calcium scoring first and then will evaluate what, if anything, is needed for cholesterol. Aware office will call to arrange testing. Patient verbalized understanding and agreeable to plan.

## 2021-01-11 ENCOUNTER — Other Ambulatory Visit: Payer: Self-pay | Admitting: Internal Medicine

## 2021-01-11 DIAGNOSIS — F17201 Nicotine dependence, unspecified, in remission: Secondary | ICD-10-CM

## 2021-01-12 ENCOUNTER — Other Ambulatory Visit: Payer: Self-pay | Admitting: Internal Medicine

## 2021-01-12 DIAGNOSIS — F17200 Nicotine dependence, unspecified, uncomplicated: Secondary | ICD-10-CM

## 2021-01-12 DIAGNOSIS — F17201 Nicotine dependence, unspecified, in remission: Secondary | ICD-10-CM

## 2021-01-13 ENCOUNTER — Other Ambulatory Visit: Payer: Self-pay | Admitting: Emergency Medicine

## 2021-01-13 ENCOUNTER — Other Ambulatory Visit: Payer: Self-pay | Admitting: Internal Medicine

## 2021-01-13 DIAGNOSIS — F17201 Nicotine dependence, unspecified, in remission: Secondary | ICD-10-CM

## 2021-02-01 ENCOUNTER — Ambulatory Visit
Admission: RE | Admit: 2021-02-01 | Discharge: 2021-02-01 | Disposition: A | Discharge status: Home / Self Care | Payer: Self-pay | Source: Ambulatory Visit | Attending: Cardiology | Admitting: Cardiology

## 2021-02-01 ENCOUNTER — Other Ambulatory Visit: Payer: Self-pay

## 2021-02-01 DIAGNOSIS — I480 Paroxysmal atrial fibrillation: Secondary | ICD-10-CM

## 2021-02-11 ENCOUNTER — Telehealth: Payer: Self-pay | Admitting: *Deleted

## 2021-02-11 NOTE — Telephone Encounter (Signed)
-----   Message from Will Meredith Leeds, MD sent at 02/01/2021 11:40 AM EDT ----- Coronary calcium score mildly elevated.  Will need fasting lipids.

## 2021-02-11 NOTE — Telephone Encounter (Signed)
Left message to call back  

## 2021-02-14 NOTE — Telephone Encounter (Signed)
Patient is returning call.  °

## 2021-02-14 NOTE — Telephone Encounter (Signed)
Returned pt call. Pt advised of result/recommendation. Pt reports that she just had cholesterol checked at PCP in February. Lab result printed and will place in folder to have Dr. Curt Bears review. Pt aware I will call once he reviews/advises. Pt agreeable to plan.

## 2021-02-15 ENCOUNTER — Ambulatory Visit
Admission: RE | Admit: 2021-02-15 | Discharge: 2021-02-15 | Disposition: A | Discharge status: Home / Self Care | Payer: Medicare Other | Source: Ambulatory Visit | Attending: Internal Medicine | Admitting: Internal Medicine

## 2021-02-15 DIAGNOSIS — F17201 Nicotine dependence, unspecified, in remission: Secondary | ICD-10-CM

## 2021-02-18 ENCOUNTER — Telehealth (INDEPENDENT_AMBULATORY_CARE_PROVIDER_SITE_OTHER): Payer: Medicare Other | Admitting: Nurse Practitioner

## 2021-02-18 ENCOUNTER — Other Ambulatory Visit: Payer: Self-pay

## 2021-02-18 DIAGNOSIS — R5382 Chronic fatigue, unspecified: Secondary | ICD-10-CM

## 2021-02-18 DIAGNOSIS — U099 Post covid-19 condition, unspecified: Secondary | ICD-10-CM

## 2021-02-18 DIAGNOSIS — Z8616 Personal history of COVID-19: Secondary | ICD-10-CM | POA: Diagnosis not present

## 2021-02-18 DIAGNOSIS — R413 Other amnesia: Secondary | ICD-10-CM

## 2021-02-18 NOTE — Patient Instructions (Signed)
History of Covid 19 Fatigue:   Stay well hydrated  Stay active  Deep breathing exercises  May take tylenolf or fever or pain  Continue PT  Continue home cognitive therapy  Follow up:  Follow up if needed   Chronic Fatigue Syndrome Chronic fatigue syndrome (CFS) is a condition that causes extreme tiredness (fatigue). This condition is also known as myalgic encephalomyelitis (ME). The fatigue in CFS does not improve with rest, and it gets worse with physical or mental activity. Several other symptoms may occur along with fatigue. Symptoms may come and go, but they generally last for months. Sometimes, CFS gets better over time. In other cases, it can be a lifelong condition. There is no cure, but there are many possible treatments. You will need to work with your health care providers to find a treatment plan that works best for you. What are the causes? The cause of this condition is not known. CFS may be caused by a combination of things. Possible causes include:  An infection.  An abnormal body defense system (abnormal immune system).  Low blood pressure.  Poor diet.  Living with a lot of physical or emotional stress. What increases the risk? The following factors may make you more likely to develop this condition:  Being female.  Being 30-34 years old.  Having a family history of CFS. What are the signs or symptoms? The main symptom of this condition is fatigue that is severe enough to interfere with day-to-day activities. This fatigue does not get better with rest, and it gets worse with physical or mental activity. There are eight other major symptoms of CFS:  Discomfort and lack of energy (malaise) that lasts more than 24 hours after physical activity.  Sleep that does not relieve fatigue (unrefreshing sleep).  Short-term memory loss or confusion.  Joint pain without redness or swelling.  Muscle aches.  Headaches.  Painful and swollen glands (lymph  nodes) in the neck or under the arms.  Sore throat. Other symptoms can include:  Cramps in the abdomen, constipation, or diarrhea (irritable bowel syndrome).  Chills and night sweats.  Vision changes.  Dizziness and mental confusion (brain fog).  Clumsiness.  Sensitivity to food, noise, or odors.  Mood swings, depression, or anxiety attacks. How is this diagnosed? There are no tests that can diagnose this condition. Your health care provider will make the diagnosis based on:  Your symptoms and medical history.  A physical exam and a mental health exam.  Tests to rule out other conditions. It is important to make sure that your symptoms are not caused by another medical condition. Tests may include lab tests or X-rays. For your health care provider to diagnose CFS:  You must have had fatigue for at least 6 straight months.  Fatigue must be your first symptom, and it must be severe enough to interfere with day-to-day activities.  You must also have at least four of the eight other major symptoms of CFS.  There must be no other cause found for the fatigue. How is this treated? There is no cure for CFS. The condition affects everyone differently. You will need to work with your team of health care providers to find the best treatments for your symptoms. Your team may include your primary care provider, physical and exercise therapists, and mental health therapists. Treatment may include:  Having a regular bedtime routine to help improve your sleep.  Avoiding caffeine, alcohol, and tobacco or nicotine products.  Doing light exercise and  stretching during the day. You may also want to try movement exercises, such as yoga or tai chi.  Taking medicines to help you sleep or to relieve joint or muscle pain.  Learning and practicing relaxation techniques, such as deep breathing and muscle relaxation.  Using memory aids or doing brainteasers to improve memory and  concentration.  Getting care for your body and mental well-being, such as: ? Seeing a mental health therapist to evaluate and treat depression, if necessary. ? Cognitive behavioral therapy (CBT). This therapy changes the way you think or act in response to the fatigue. This may help improve how you feel. ? Trying massage therapy and acupuncture.   Follow these instructions at home: Eating and drinking  Avoid caffeine and alcohol.  Avoid heavy meals in the evening.  Eat a healthy diet that includes foods such as vegetables, fruits, fish, and lean meats.   Activity  Rest as told by your health care provider.  Avoid fatigue by pacing yourself during the day and getting enough sleep at night.  Exercise regularly, as told by your health care provider.  Go to bed and get up at the same time every day. Lifestyle  Ask your health care provider whether you should keep a diary. Your health care provider will tell you what information to write in the diary. This may include when you have fatigue and how medicines and other behaviors or treatments help to reduce the fatigue.  Consider joining a CFS support group.  Avoid stress and use stress-reducing techniques that you learn in therapy. General instructions  Take over-the-counter and prescription medicines only as told by your health care provider.  Do not use herbal or dietary supplements unless they are approved by your health care provider.  Maintain a healthy weight.  Do not use any products that contain nicotine or tobacco, such as cigarettes, e-cigarettes, and chewing tobacco. If you need help quitting, ask your health care provider.  Keep all follow-up visits as told by your health care provider. This is important.   Where to find more information Get more information or find a support group near you at one of these links:  American Myalgic Encephalomyelitis and Chronic Fatigue Syndrome Society: ammes.org  Centers for  Disease Control and Prevention: http://www.wolf.info/ Contact a health care provider if:  Your symptoms do not get better or they get worse.  You feel angry, guilty, anxious, or depressed. Get help right away if:  You have thoughts of self-harm. If you ever feel like you may hurt yourself or others, or have thoughts about taking your own life, get help right away. Go to your nearest emergency department or:  Call your local emergency services (911 in the [place]  Call a suicide crisis helpline, such as the [facility] at 2722560502. This is open 24 hours a day in the [place]  Text the Crisis Text Line at (646)352-9141 (in the [place].). Summary  Chronic fatigue syndrome (CFS) is a condition that causes extreme tiredness (fatigue). This fatigue does not improve with rest, and it gets worse with physical or mental activity.  There is no cure for CFS. The condition affects everyone differently. You will need to work with your team of health care providers to find the best treatments for your symptoms.  Exercise regularly, as told by your health care provider. Avoid stress and use stress-reducing techniques that you learn in therapy.  Contact a health care provider if your symptoms do not get better or they  get worse. This information is not intended to replace advice given to you by your health care provider. Make sure you discuss any questions you have with your health care provider. Document Revised: 09/01/2019 Document Reviewed: 09/01/2019 Elsevier Patient Education  2021 [employer].

## 2021-02-18 NOTE — Progress Notes (Signed)
Virtual Visit via Telephone Note  I connected with Deborah Ortiz on 02/18/21 at  8:00 AM EDT by telephone and verified that I am speaking with the correct person using two identifiers.  Location: Patient: home Provider: office   I discussed the limitations, risks, security and privacy concerns of performing an evaluation and management service by telephone and the availability of in person appointments. I also discussed with the patient that there may be a patient responsible charge related to this service. The patient expressed understanding and agreed to proceed.   History of Present Illness:  Patient presents today for post-COVID care clinic visit through televisit.  Patient was diagnosed with COVID in November 2021.  She states that she has been suffering with chronic fatigue since that time.  She has also noticed ongoing memory loss and brain fog.  She was having GI issues but states that they have recently resolved.  She has been followed by her primary care physician and cardiology.  She is also currently participating in physical therapy.  She states that this has helped tremendously.  She does still have days where she feels like she crashes and is very fatigued.  We discussed that this is common for many patients post-COVID.  Hopefully with continued physical therapy this will improve significantly over the next couple months.  We discussed that she can get a referral to speech therapy for cognitive rehabilitation.  Patient is working on memory care at home for work with puzzles and joining a book club.  She states if she notices that her memory does not improve that she will call back for referral to speech therapy.  We discussed the importance of staying well-hydrated and good nutrition and vitamins. Denies f/c/s, n/v/d, hemoptysis, PND, chest pain or edema.     Observations/Objective:  Vitals with BMI 11/12/2020 09/26/2020 09/26/2020  Height _0  - _1   Weight 173 lbs 6 oz -  172 lbs 6 oz  BMI 15.72 - 62.03  Systolic [phone]  Diastolic 88 75 83  Pulse 86 61 72      Assessment and Plan:  History of Covid 19 Fatigue:   Stay well hydrated  Stay active  Deep breathing exercises  May take tylenolf or fever or pain  Continue PT  Continue home cognitive therapy  Follow up:  Follow up if needed   Patient Instructions   History of Covid 19 Fatigue:   Stay well hydrated  Stay active  Deep breathing exercises  May take tylenolf or fever or pain  Continue PT  Continue home cognitive therapy  Follow up:  Follow up if needed   Chronic Fatigue Syndrome Chronic fatigue syndrome (CFS) is a condition that causes extreme tiredness (fatigue). This condition is also known as myalgic encephalomyelitis (ME). The fatigue in CFS does not improve with rest, and it gets worse with physical or mental activity. Several other symptoms may occur along with fatigue. Symptoms may come and go, but they generally last for months. Sometimes, CFS gets better over time. In other cases, it can be a lifelong condition. There is no cure, but there are many possible treatments. You will need to work with your health care providers to find a treatment plan that works best for you. What are the causes? The cause of this condition is not known. CFS may be caused by a combination of things. Possible causes include:  An infection.  An abnormal body defense system (abnormal immune system).  Low blood  pressure.  Poor diet.  Living with a lot of physical or emotional stress. What increases the risk? The following factors may make you more likely to develop this condition:  Being female.  Being 26-53 years old.  Having a family history of CFS. What are the signs or symptoms? The main symptom of this condition is fatigue that is severe enough to interfere with day-to-day activities. This fatigue does not get better with rest, and it gets worse with physical  or mental activity. There are eight other major symptoms of CFS:  Discomfort and lack of energy (malaise) that lasts more than 24 hours after physical activity.  Sleep that does not relieve fatigue (unrefreshing sleep).  Short-term memory loss or confusion.  Joint pain without redness or swelling.  Muscle aches.  Headaches.  Painful and swollen glands (lymph nodes) in the neck or under the arms.  Sore throat. Other symptoms can include:  Cramps in the abdomen, constipation, or diarrhea (irritable bowel syndrome).  Chills and night sweats.  Vision changes.  Dizziness and mental confusion (brain fog).  Clumsiness.  Sensitivity to food, noise, or odors.  Mood swings, depression, or anxiety attacks. How is this diagnosed? There are no tests that can diagnose this condition. Your health care provider will make the diagnosis based on:  Your symptoms and medical history.  A physical exam and a mental health exam.  Tests to rule out other conditions. It is important to make sure that your symptoms are not caused by another medical condition. Tests may include lab tests or X-rays. For your health care provider to diagnose CFS:  You must have had fatigue for at least 6 straight months.  Fatigue must be your first symptom, and it must be severe enough to interfere with day-to-day activities.  You must also have at least four of the eight other major symptoms of CFS.  There must be no other cause found for the fatigue. How is this treated? There is no cure for CFS. The condition affects everyone differently. You will need to work with your team of health care providers to find the best treatments for your symptoms. Your team may include your primary care provider, physical and exercise therapists, and mental health therapists. Treatment may include:  Having a regular bedtime routine to help improve your sleep.  Avoiding caffeine, alcohol, and tobacco or nicotine  products.  Doing light exercise and stretching during the day. You may also want to try movement exercises, such as yoga or tai chi.  Taking medicines to help you sleep or to relieve joint or muscle pain.  Learning and practicing relaxation techniques, such as deep breathing and muscle relaxation.  Using memory aids or doing brainteasers to improve memory and concentration.  Getting care for your body and mental well-being, such as: ? Seeing a mental health therapist to evaluate and treat depression, if necessary. ? Cognitive behavioral therapy (CBT). This therapy changes the way you think or act in response to the fatigue. This may help improve how you feel. ? Trying massage therapy and acupuncture.   Follow these instructions at home: Eating and drinking  Avoid caffeine and alcohol.  Avoid heavy meals in the evening.  Eat a healthy diet that includes foods such as vegetables, fruits, fish, and lean meats.   Activity  Rest as told by your health care provider.  Avoid fatigue by pacing yourself during the day and getting enough sleep at night.  Exercise regularly, as told by your health care  provider.  Go to bed and get up at the same time every day. Lifestyle  Ask your health care provider whether you should keep a diary. Your health care provider will tell you what information to write in the diary. This may include when you have fatigue and how medicines and other behaviors or treatments help to reduce the fatigue.  Consider joining a CFS support group.  Avoid stress and use stress-reducing techniques that you learn in therapy. General instructions  Take over-the-counter and prescription medicines only as told by your health care provider.  Do not use herbal or dietary supplements unless they are approved by your health care provider.  Maintain a healthy weight.  Do not use any products that contain nicotine or tobacco, such as cigarettes, e-cigarettes, and chewing  tobacco. If you need help quitting, ask your health care provider.  Keep all follow-up visits as told by your health care provider. This is important.   Where to find more information Get more information or find a support group near you at one of these links:  American Myalgic Encephalomyelitis and Chronic Fatigue Syndrome Society: ammes.org  Centers for Disease Control and Prevention: http://www.wolf.info/ Contact a health care provider if:  Your symptoms do not get better or they get worse.  You feel angry, guilty, anxious, or depressed. Get help right away if:  You have thoughts of self-harm. If you ever feel like you may hurt yourself or others, or have thoughts about taking your own life, get help right away. Go to your nearest emergency department or:  Call your local emergency services (911 in the [place]  Call a suicide crisis helpline, such as the [facility] at 551-152-5601. This is open 24 hours a day in the [place]  Text the Crisis Text Line at 343-050-8184 (in the [place].). Summary  Chronic fatigue syndrome (CFS) is a condition that causes extreme tiredness (fatigue). This fatigue does not improve with rest, and it gets worse with physical or mental activity.  There is no cure for CFS. The condition affects everyone differently. You will need to work with your team of health care providers to find the best treatments for your symptoms.  Exercise regularly, as told by your health care provider. Avoid stress and use stress-reducing techniques that you learn in therapy.  Contact a health care provider if your symptoms do not get better or they get worse. This information is not intended to replace advice given to you by your health care provider. Make sure you discuss any questions you have with your health care provider. Document Revised: 09/01/2019 Document Reviewed: 09/01/2019 Elsevier Patient Education  2021 [place].        I discussed the assessment  and treatment plan with the patient. The patient was provided an opportunity to ask questions and all were answered. The patient agreed with the plan and demonstrated an understanding of the instructions.   The patient was advised to call back or seek an in-person evaluation if the symptoms worsen or if the condition fails to improve as anticipated.  I provided 23 minutes of non-face-to-face time during this encounter.   Fenton Foy, NP

## 2021-03-01 DIAGNOSIS — R413 Other amnesia: Secondary | ICD-10-CM | POA: Insufficient documentation

## 2021-03-01 DIAGNOSIS — Z8616 Personal history of COVID-19: Secondary | ICD-10-CM | POA: Insufficient documentation

## 2021-03-24 ENCOUNTER — Other Ambulatory Visit: Payer: Self-pay

## 2021-03-24 ENCOUNTER — Ambulatory Visit
Admission: RE | Admit: 2021-03-24 | Discharge: 2021-03-24 | Disposition: A | Discharge status: Home / Self Care | Payer: Medicare Other | Source: Ambulatory Visit | Attending: Internal Medicine | Admitting: Internal Medicine

## 2021-03-24 DIAGNOSIS — Z1231 Encounter for screening mammogram for malignant neoplasm of breast: Secondary | ICD-10-CM

## 2021-03-28 ENCOUNTER — Other Ambulatory Visit: Payer: Self-pay | Admitting: Internal Medicine

## 2021-03-28 DIAGNOSIS — R928 Other abnormal and inconclusive findings on diagnostic imaging of breast: Secondary | ICD-10-CM

## 2021-04-15 ENCOUNTER — Other Ambulatory Visit: Payer: Self-pay

## 2021-04-15 ENCOUNTER — Ambulatory Visit
Admission: RE | Admit: 2021-04-15 | Discharge: 2021-04-15 | Disposition: A | Discharge status: Home / Self Care | Payer: Medicare Other | Source: Ambulatory Visit | Attending: Internal Medicine | Admitting: Internal Medicine

## 2021-04-15 DIAGNOSIS — R928 Other abnormal and inconclusive findings on diagnostic imaging of breast: Secondary | ICD-10-CM

## 2021-04-25 ENCOUNTER — Other Ambulatory Visit: Payer: Self-pay | Admitting: Internal Medicine

## 2021-04-25 ENCOUNTER — Ambulatory Visit
Admission: RE | Admit: 2021-04-25 | Discharge: 2021-04-25 | Disposition: A | Discharge status: Home / Self Care | Payer: Medicare Other | Source: Ambulatory Visit | Attending: Internal Medicine | Admitting: Internal Medicine

## 2021-04-25 DIAGNOSIS — R0781 Pleurodynia: Secondary | ICD-10-CM

## 2021-05-24 ENCOUNTER — Other Ambulatory Visit: Payer: Self-pay | Admitting: Cardiology

## 2021-05-24 NOTE — Telephone Encounter (Signed)
Xarelto '20mg'$  refill request received. Pt is 65 years old, weight-78.7kg, Crea-0.62 on 09/26/2020, last seen by Dr. Curt Bears on 11/12/2020 & pending an appt on 9/8/222, Diagnosis-Afib, CrCl-109.79m/min; Dose is appropriate based on dosing criteria. Will send in refill to requested pharmacy.

## 2021-06-09 ENCOUNTER — Ambulatory Visit: Payer: Medicare Other | Admitting: Cardiology

## 2021-06-14 ENCOUNTER — Ambulatory Visit (INDEPENDENT_AMBULATORY_CARE_PROVIDER_SITE_OTHER): Payer: Medicare Other | Admitting: Cardiology

## 2021-06-14 ENCOUNTER — Encounter: Payer: Self-pay | Admitting: Cardiology

## 2021-06-14 ENCOUNTER — Other Ambulatory Visit: Payer: Self-pay

## 2021-06-14 VITALS — BP 128/72 | HR 80 | Ht 64.0 in | Wt 172.0 lb

## 2021-06-14 DIAGNOSIS — I480 Paroxysmal atrial fibrillation: Secondary | ICD-10-CM | POA: Diagnosis not present

## 2021-06-14 NOTE — Patient Instructions (Signed)
Medication Instructions:  °Your physician recommends that you continue on your current medications as directed. Please refer to the Current Medication list given to you today. ° °*If you need a refill on your cardiac medications before your next appointment, please call your pharmacy* ° ° °Lab Work: °None ordered ° ° °Testing/Procedures: °None ordered ° ° °Follow-Up: °At [facility], you and your health needs are our priority.  As part of our continuing mission to provide you with exceptional heart care, we have created designated Provider Care Teams.  These Care Teams include your primary Cardiologist (physician) and Advanced Practice Providers (APPs -  Physician Assistants and Nurse Practitioners) who all work together to provide you with the care you need, when you need it. ° °Your next appointment:   °6 month(s) ° °The format for your next appointment:   °In Person ° °Provider:   °Will Camnitz, MD ° ° ° °Thank you for choosing [facility]!! ° ° °Shade Rivenbark, RN °[phone] °  °

## 2021-06-14 NOTE — Progress Notes (Addendum)
Electrophysiology Office Note   Date:  06/14/2021   ID:  Yatzil, Samudio [birth year], MRN [id number]  PCP:  [name], MD  Cardiologist:   Primary Electrophysiologist:  Anajulia Leyendecker Meredith Leeds, MD    No chief complaint on file.    History of Present Illness: Deborah Ortiz is a 65 y.o. female who is being seen today for the evaluation of atrial fibrillation at the request of [name], MD. Presenting today for electrophysiology evaluation.    She has a history significant for paroxysmal atrial fibrillation, mitral regurgitation, and PVCs.  November 2018 she started to feel poorly after a nail infection.  She was noted to be in atrial fibrillation was started on Xarelto and sotalol.  She had a cardioversion.  She had recurrent atrial fibrillation and status post cryoablation April 2019.  Sotalol was stopped due to dizziness.  She was noted to have an elevated PVC burden at 13%.  She continues to have mild palpitations, but otherwise feels well.  Over the last year, she has had multiple issues with COVID 19 and bronchitis.  She feels weak and fatigued a lot of the time.  At this point, she does not feel that it is due to her arrhythmias.  She is overall comfortable with her control.  Today, denies symptoms of palpitations, chest pain, shortness of breath, orthopnea, PND, lower extremity edema, claudication, dizziness, presyncope, syncope, bleeding, or neurologic sequela. The patient is tolerating medications without difficulties.    Past Medical History:  Diagnosis Date   Long term (current) use of anticoagulants    Mitral valve regurgitation    Palpitations    Paroxysmal atrial fibrillation (HCC)    Pneumonia    HISTORY   Sinusitis, acute    Past Surgical History:  Procedure Laterality Date   CRYOABLATION     heart   HERNIA REPAIR     TONSILLECTOMY     TUBAL LIGATION       Current Outpatient Medications  Medication Sig Dispense Refill   albuterol  (VENTOLIN HFA) 108 (90 Base) MCG/ACT inhaler Inhale 2 puffs into the lungs every 6 (six) hours as needed for wheezing or shortness of breath. 8 g 2   Ascorbic Acid (VITAMIN C PO) Take 1 tablet by mouth daily.     BIOTIN PO Take by mouth daily.     Calcium-Vitamin D-Vitamin K (CALCIUM + D) [phone] MG-UNT-MCG CHEW See admin instructions.     Cholecalciferol (VITAMIN D-3) 1000 units CAPS Take 1 capsule by mouth daily.     COLLAGEN PO Take by mouth daily.     Glucosamine HCl (GLUCOSAMINE PO) Take 1 capsule by mouth daily.     Multiple Vitamin (MULTIVITAMIN) capsule Take 1 capsule by mouth daily.     Omega-3 Fatty Acids (FISH OIL PO) Take 1 capsule by mouth daily.      Probiotic Product (PROBIOTIC PO) Take 1 tablet by mouth daily.     rivaroxaban (XARELTO) 20 MG TABS tablet TAKE 1 TABLET (20 MG TOTAL) BY MOUTH DAILY WITH SUPPER. 30 tablet 5   No current facility-administered medications for this visit.    Allergies:   Doxycycline, Metoprolol, and Nitrofurantoin   Social History:  The patient  reports that she quit smoking about 13 years ago. Her smoking use included cigarettes. She has a 30.00 pack-year smoking history. She has never used smokeless tobacco. She reports current alcohol use. She reports that she does not use drugs.   Family History:  The patient's  family history includes CVA in her mother; Cancer in her father and mother; Diabetes in her mother; Heart disease in her mother; Stroke in her mother.   ROS:  Please see the history of present illness.   Otherwise, review of systems is positive for none.   All other systems are reviewed and negative.   PHYSICAL EXAM: VS:  BP 128/72   Pulse 80   Ht '5\' 4"'$  (1.626 m)   Wt 172 lb (78 kg)   SpO2 97%   BMI 29.52 kg/m  , BMI Body mass index is 29.52 kg/m. GEN: Well nourished, well developed, in no acute distress  HEENT: normal  Neck: no JVD, carotid bruits, or masses Cardiac: RRR; no murmurs, rubs, or gallops,no edema  Respiratory:   clear to auscultation bilaterally, normal work of breathing GI: soft, nontender, nondistended, + BS MS: no deformity or atrophy  Skin: warm and dry Neuro:  Strength and sensation are intact Psych: euthymic mood, full affect  EKG:  EKG is ordered today. Personal review of the ekg ordered shows sinus rhythm, PVC   Recent Labs: 09/25/2020: ALT 31; B Natriuretic Peptide 128.6; TSH 2.386 09/26/2020: BUN 20; Creatinine, Ser 0.62; Hemoglobin 12.2; Platelets 151; Potassium 3.6; Sodium 140    Lipid Panel  No results found for: CHOL, TRIG, HDL, CHOLHDL, VLDL, LDLCALC, LDLDIRECT   Wt Readings from Last 3 Encounters:  06/14/21 172 lb (78 kg)  11/12/20 173 lb 6.4 oz (78.7 kg)  09/26/20 172 lb 6.4 oz (78.2 kg)      Other studies Reviewed: Additional studies/ records that were reviewed today include: TTE 09/26/2020  Review of the above records today demonstrates:   1. Left ventricular ejection fraction, by estimation, is 65 to 70%. The  left ventricle has normal function. The left ventricle has no regional  wall motion abnormalities. Left ventricular diastolic parameters were  normal.   2. Right ventricular systolic function is normal. The right ventricular  size is normal. There is normal pulmonary artery systolic pressure.   3. The mitral valve is normal in structure. Mild mitral valve  regurgitation. No evidence of mitral stenosis.   4. The aortic valve is normal in structure. Aortic valve regurgitation is  not visualized. No aortic stenosis is present.   5. The inferior vena cava is normal in size with greater than 50%  respiratory variability, suggesting right atrial pressure of 3 mmHg.   Cardiac CT: LAD eccentric calcified plaque less than 50% Circumflex no significant stenosis RCA motion artifact throughout precludes accurate assessment Left main no significant narrowing  Cardiac monitor 05/04/2020 personally reviewed Max 187 bpm 07:27pm, 07/18 Min 52 bpm 05:22am, 07/18 Avg  73 bpm 1% PACs 13.4% PVCs 26 VT runs, longest 5 beats 9 SVT runs noted, longest 27 seconds at 116 bpm Triggered events associated with sinus rhythm, PVCs, SVT   ASSESSMENT AND PLAN:  1.  Paroxysmal atrial fibrillation: Currently on Xarelto.  Status post cryoablation.  CHA2DS2-VASc of 2.  She is fortunately remained in sinus rhythm.    She remained in sinus rhythm with only minimal episodes.  No changes.  We Kassaundra Hair get in touch with her primary physician's office to fax recent labs.  2.  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  Monitor with a 13% burden.  She does note intermittent palpitations but otherwise feels fine most of the time.  No changes.  Current medicines are reviewed at length with the patient today.   The patient does not have concerns  regarding her medicines.  The following changes were made today: None  Labs/ tests ordered today include:  Orders Placed This Encounter  Procedures   EKG 12-Lead      Disposition:   FU with Niranjan Rufener 6 months  Signed, Johnae Friley Meredith Leeds, MD  06/14/2021 2:49 PM     [facility] [address] [place] [address] [phone] (office) [phone] (fax)

## 2021-07-19 ENCOUNTER — Ambulatory Visit
Admission: RE | Admit: 2021-07-19 | Discharge: 2021-07-19 | Disposition: A | Discharge status: Home / Self Care | Payer: Medicare Other | Source: Ambulatory Visit | Attending: Internal Medicine | Admitting: Internal Medicine

## 2021-07-19 ENCOUNTER — Other Ambulatory Visit: Payer: Self-pay

## 2021-07-19 ENCOUNTER — Other Ambulatory Visit: Payer: Self-pay | Admitting: Internal Medicine

## 2021-07-19 DIAGNOSIS — R140 Abdominal distension (gaseous): Secondary | ICD-10-CM

## 2021-07-19 DIAGNOSIS — C259 Malignant neoplasm of pancreas, unspecified: Secondary | ICD-10-CM

## 2021-07-19 DIAGNOSIS — R109 Unspecified abdominal pain: Secondary | ICD-10-CM

## 2021-08-08 ENCOUNTER — Ambulatory Visit: Payer: Medicare Other | Admitting: Cardiology

## 2021-10-26 ENCOUNTER — Other Ambulatory Visit: Payer: Self-pay | Admitting: Cardiology

## 2021-10-26 NOTE — Telephone Encounter (Signed)
Prescription refill request for Xarelto received.  Indication: Afib  Last office visit: 06/14/21 ([place])  Weight: 78kg Age: 66 Scr: 0.76 (11/15/20)  CrCl: 88.76ml/min  Appropriate dose and refill sent to requested pharmacy.

## 2021-11-07 ENCOUNTER — Encounter: Payer: Self-pay | Admitting: Podiatry

## 2021-11-07 ENCOUNTER — Ambulatory Visit (INDEPENDENT_AMBULATORY_CARE_PROVIDER_SITE_OTHER): Payer: Medicare Other

## 2021-11-07 ENCOUNTER — Ambulatory Visit (INDEPENDENT_AMBULATORY_CARE_PROVIDER_SITE_OTHER): Payer: Medicare Other | Admitting: Podiatry

## 2021-11-07 ENCOUNTER — Other Ambulatory Visit: Payer: Self-pay

## 2021-11-07 DIAGNOSIS — M7752 Other enthesopathy of left foot: Secondary | ICD-10-CM

## 2021-11-07 DIAGNOSIS — M779 Enthesopathy, unspecified: Secondary | ICD-10-CM

## 2021-11-07 DIAGNOSIS — M722 Plantar fascial fibromatosis: Secondary | ICD-10-CM

## 2021-11-07 MED ORDER — TRIAMCINOLONE ACETONIDE 10 MG/ML IJ SUSP
20.0000 mg | Freq: Once | INTRAMUSCULAR | Status: AC
  Administered 2021-11-07: 20 mg

## 2021-11-08 NOTE — Progress Notes (Signed)
Subjective:   Patient ID: Deborah Ortiz, female   DOB: 66 y.o.   MRN: [id number]   HPI Patient presents stating that the left ankle joints been quite sore and the right tendon underneath the foot has been very tender.  States both of these of been going on now for about a month worse over the last couple weeks and it feels inflamed and she does not remember injury   ROS      Objective:  Physical Exam  Neurovascular status intact with inflammation of the plantar fascial insertion right fluid buildup and inflammation pain of the sinus tarsi left and into the lateral left foot     Assessment:  Acute plantar fasciitis right with sinus tarsitis noted left     Plan:  H&P reviewed both conditions and x-rays.  Sterile prep and for the right I injected the plantar fascia 3 mg Kenalog 5 mg Xylocaine and for the left I did sterile prep and injected the sinus tarsi 3 mg Kenalog 5 mg Xylocaine and advised on reduced activity and possibility that we may have to mobilize depending on response to conservative treatment  X-rays indicate there is no signs of arthritis no signs of bony condition associated with this with moderate plantar spur formation

## 2021-12-07 ENCOUNTER — Other Ambulatory Visit: Payer: Self-pay | Admitting: Obstetrics and Gynecology

## 2021-12-07 ENCOUNTER — Other Ambulatory Visit: Payer: Self-pay | Admitting: Internal Medicine

## 2021-12-07 DIAGNOSIS — Z1231 Encounter for screening mammogram for malignant neoplasm of breast: Secondary | ICD-10-CM

## 2021-12-16 ENCOUNTER — Ambulatory Visit: Payer: Medicare Other | Admitting: Cardiology

## 2022-01-02 ENCOUNTER — Ambulatory Visit: Payer: Medicare Other | Admitting: Cardiology

## 2022-01-04 ENCOUNTER — Ambulatory Visit (INDEPENDENT_AMBULATORY_CARE_PROVIDER_SITE_OTHER): Payer: Medicare Other | Admitting: Podiatry

## 2022-01-04 ENCOUNTER — Encounter: Payer: Self-pay | Admitting: Podiatry

## 2022-01-04 DIAGNOSIS — M722 Plantar fascial fibromatosis: Secondary | ICD-10-CM

## 2022-01-04 MED ORDER — TRIAMCINOLONE ACETONIDE 10 MG/ML IJ SUSP
10.0000 mg | Freq: Once | INTRAMUSCULAR | Status: AC
  Administered 2022-01-04: 10 mg

## 2022-01-04 NOTE — Progress Notes (Signed)
Subjective:  ? ?Patient ID: Deborah Ortiz, female   DOB: 66 y.o.   MRN: [id number]  ? ?HPI ?Patient states the left ankle is resolved but severe discomfort plantar aspect right heel still present and gotten worse recently neuro ? ? ?ROS ? ? ?   ?Objective:  ?Physical Exam  ?Vascular status intact exquisite discomfort plantar fascia right at insertion to calcaneus with improved left ankle pain ? ?   ?Assessment:  ?Acute Planter fasciitis right ? ?   ?Plan:  ?H&P reviewed condition sterile prep and injected the fascia 3 mg Kenalog 5 mg Xylocaine and went ahead and dispensed night splint with all instructions on usage along with aggressive ice therapy.  Patient will be seen back for [place] to recheck again and is encouraged to call questions concerns and may require complete immobilization possible surgery in future ?   ? ? ?

## 2022-01-25 ENCOUNTER — Ambulatory Visit: Payer: Medicare Other | Admitting: Podiatry

## 2022-01-26 ENCOUNTER — Other Ambulatory Visit: Payer: Self-pay | Admitting: Cardiology

## 2022-01-26 DIAGNOSIS — I480 Paroxysmal atrial fibrillation: Secondary | ICD-10-CM

## 2022-01-26 NOTE — Telephone Encounter (Signed)
Prescription refill request for Xarelto received.  ?Indication: Afib  ?Last office visit: 06/14/21 [facility]) ?Weight: 78kg ?Age: 65 ?Scr: 0.76 (11/15/20 via [place]) ?CrCl: 88.2m/min ? ?Labs overdue. Pt has scheduled appt on 04/12/22 with Dr CCurt Bearsand will have labs drawn at appt. Appropriate dose and refill sent to requested pharmacy.  ?

## 2022-02-08 ENCOUNTER — Ambulatory Visit: Payer: Medicare Other | Admitting: Podiatry

## 2022-02-10 ENCOUNTER — Ambulatory Visit (INDEPENDENT_AMBULATORY_CARE_PROVIDER_SITE_OTHER): Payer: Medicare Other | Admitting: Podiatry

## 2022-02-10 ENCOUNTER — Encounter: Payer: Self-pay | Admitting: Podiatry

## 2022-02-10 DIAGNOSIS — M7671 Peroneal tendinitis, right leg: Secondary | ICD-10-CM | POA: Diagnosis not present

## 2022-02-10 DIAGNOSIS — M722 Plantar fascial fibromatosis: Secondary | ICD-10-CM | POA: Diagnosis not present

## 2022-02-10 NOTE — Progress Notes (Signed)
Subjective:  ? ?Patient ID: Deborah Ortiz, female   DOB: 66 y.o.   MRN: [id number]  ? ?HPI ?Patient states she is quite a bit improved and states while she is getting some pain at times in her heel and the outside of her foot its not anywhere near to the intensity that what she experienced previously ? ? ?ROS ? ? ?   ?Objective:  ?Physical Exam  ?Neurovascular status intact with discomfort in the plantar heel right lateral foot mild to moderate in intensity with no areas of acute inflammation currently ? ?   ?Assessment:  ?Appears to be inflammatory and is low-grade with no high-grade inflammation currently ? ?   ?Plan:  ?Reviewed with patient and at this point organ to hold off on any further cortisone injections but may be necessary for the outside of the foot in the future and continue with stretching exercises and night splint that she has at home that she had from previous ?   ? ? ?

## 2022-02-13 ENCOUNTER — Encounter: Payer: Self-pay | Admitting: Cardiology

## 2022-02-22 NOTE — Telephone Encounter (Signed)
Patient is requesting Deborah Ortiz give her a call regarding this tomorrow 05/25 afternoon or Friday 02/24/22 morning.

## 2022-02-28 ENCOUNTER — Encounter: Payer: Self-pay | Admitting: Cardiology

## 2022-02-28 DIAGNOSIS — I480 Paroxysmal atrial fibrillation: Secondary | ICD-10-CM

## 2022-02-28 MED ORDER — RIVAROXABAN 20 MG PO TABS: 20.0000 mg | ORAL_TABLET | Freq: Every day | ORAL | 5 refills | Status: DC

## 2022-03-21 ENCOUNTER — Encounter: Payer: Self-pay | Admitting: *Deleted

## 2022-03-21 ENCOUNTER — Ambulatory Visit (INDEPENDENT_AMBULATORY_CARE_PROVIDER_SITE_OTHER): Payer: Medicare Other | Admitting: Cardiology

## 2022-03-21 ENCOUNTER — Encounter: Payer: Self-pay | Admitting: Cardiology

## 2022-03-21 VITALS — BP 124/86 | HR 128 | Ht 64.0 in | Wt 172.4 lb

## 2022-03-21 DIAGNOSIS — I4819 Other persistent atrial fibrillation: Secondary | ICD-10-CM

## 2022-03-21 DIAGNOSIS — I480 Paroxysmal atrial fibrillation: Secondary | ICD-10-CM | POA: Diagnosis not present

## 2022-03-21 DIAGNOSIS — D6869 Other thrombophilia: Secondary | ICD-10-CM

## 2022-03-21 DIAGNOSIS — Z79899 Other long term (current) drug therapy: Secondary | ICD-10-CM | POA: Diagnosis not present

## 2022-03-21 MED ORDER — DILTIAZEM HCL ER COATED BEADS 180 MG PO CP24: 180.0000 mg | ORAL_CAPSULE | Freq: Every day | ORAL | 6 refills | Status: DC

## 2022-03-21 NOTE — Progress Notes (Signed)
Electrophysiology Office Note   Date:  03/21/2022   ID:  Deborah, Ortiz 07/13/1956, MRN [id number]  PCP:  [name], MD  Cardiologist:   Primary Electrophysiologist:  Raphael Fitzpatrick Meredith Leeds, MD    No chief complaint on file.     History of Present Illness: Deborah Ortiz is a 66 y.o. female who is being seen today for the evaluation of atrial fibrillation at the request of [name], MD. Presenting today for electrophysiology evaluation.    She has a history significant for paroxysmal atrial fibrillation, mitral vegetation, PVCs.  November 2018 she started feel poorly after nail infection.  She was noted to be in atrial fibrillation was started on Xarelto and sotalol.  She had a cardioversion.  She is status post cryoablation April 2019.  Sotalol was stopped due to dizziness.  She was found to have an elevated burden of PVCs at 13%.  Today, denies symptoms of palpitations, chest pain, shortness of breath, orthopnea, PND, lower extremity edema, claudication, dizziness, presyncope, syncope, bleeding, or neurologic sequela. The patient is tolerating medications without difficulties.  Over the past few weeks, she has had increasing palpitations.  She initially thought that these palpitations were due to PVCs.  Review of ECG today shows atrial flutter.  She has some mild weakness and shortness of breath but otherwise feels well.  She does not feel that she is in atrial flutter consistently.  Past Medical History:  Diagnosis Date   Long term (current) use of anticoagulants    Mitral valve regurgitation    Palpitations    Paroxysmal atrial fibrillation (HCC)    Pneumonia    HISTORY   Sinusitis, acute    Past Surgical History:  Procedure Laterality Date   CRYOABLATION     heart   HERNIA REPAIR     TONSILLECTOMY     TUBAL LIGATION       Current Outpatient Medications  Medication Sig Dispense Refill   albuterol (VENTOLIN HFA) 108 (90 Base) MCG/ACT inhaler  Inhale 2 puffs into the lungs every 6 (six) hours as needed for wheezing or shortness of breath. 8 g 2   Ascorbic Acid (VITAMIN C PO) Take 1 tablet by mouth daily.     BIOTIN PO Take by mouth daily.     Calcium-Vitamin D-Vitamin K (CALCIUM + D) [phone] MG-UNT-MCG CHEW See admin instructions.     Carboxymethylcellul-Glycerin (REFRESH OPTIVE OP) Apply to eye. 1 vial per day as needed     Cholecalciferol (VITAMIN D-3) 1000 units CAPS Take 1 capsule by mouth daily.     COLLAGEN PO Take by mouth daily.     diltiazem (CARDIZEM CD) 180 MG 24 hr capsule Take 1 capsule (180 mg total) by mouth daily. 30 capsule 6   Glucosamine HCl (GLUCOSAMINE PO) Take 1 capsule by mouth daily.     Multiple Vitamin (MULTIVITAMIN) capsule Take 1 capsule by mouth daily.     Omega-3 Fatty Acids (FISH OIL PO) Take 1 capsule by mouth daily.      Probiotic Product (PROBIOTIC PO) Take 1 tablet by mouth daily.     rivaroxaban (XARELTO) 20 MG TABS tablet Take 1 tablet (20 mg total) by mouth daily with supper. 30 tablet 5   No current facility-administered medications for this visit.    Allergies:   Doxycycline, Metoprolol, and Nitrofurantoin   Social History:  The patient  reports that she quit smoking about 14 years ago. Her smoking use included cigarettes. She has a 30.00 pack-year  smoking history. She has never used smokeless tobacco. She reports current alcohol use. She reports that she does not use drugs.   Family History:  The patient's family history includes CVA in her mother; Cancer in her father and mother; Diabetes in her mother; Heart disease in her mother; Stroke in her mother.   ROS:  Please see the history of present illness.   Otherwise, review of systems is positive for none.   All other systems are reviewed and negative.   PHYSICAL EXAM: VS:  BP 124/86   Pulse (!) 128   Ht '5\' 4"'$  (1.626 m)   Wt 172 lb 6.4 oz (78.2 kg)   SpO2 97%   BMI 29.59 kg/m  , BMI Body mass index is 29.59 kg/m. GEN: Well  nourished, well developed, in no acute distress  HEENT: normal  Neck: no JVD, carotid bruits, or masses Cardiac: RRR; no murmurs, rubs, or gallops,no edema  Respiratory:  clear to auscultation bilaterally, normal work of breathing GI: soft, nontender, nondistended, + BS MS: no deformity or atrophy  Skin: warm and dry Neuro:  Strength and sensation are intact Psych: euthymic mood, full affect  EKG:  EKG is ordered today. Personal review of the ekg ordered shows atrial flutter, rate 128  Recent Labs: No results found for requested labs within last 365 days.    Lipid Panel  No results found for: "CHOL", "TRIG", "HDL", "CHOLHDL", "VLDL", "LDLCALC", "LDLDIRECT"   Wt Readings from Last 3 Encounters:  03/21/22 172 lb 6.4 oz (78.2 kg)  06/14/21 172 lb (78 kg)  11/12/20 173 lb 6.4 oz (78.7 kg)      Other studies Reviewed: Additional studies/ records that were reviewed today include: TTE 09/26/2020  Review of the above records today demonstrates:   1. Left ventricular ejection fraction, by estimation, is 65 to 70%. The  left ventricle has normal function. The left ventricle has no regional  wall motion abnormalities. Left ventricular diastolic parameters were  normal.   2. Right ventricular systolic function is normal. The right ventricular  size is normal. There is normal pulmonary artery systolic pressure.   3. The mitral valve is normal in structure. Mild mitral valve  regurgitation. No evidence of mitral stenosis.   4. The aortic valve is normal in structure. Aortic valve regurgitation is  not visualized. No aortic stenosis is present.   5. The inferior vena cava is normal in size with greater than 50%  respiratory variability, suggesting right atrial pressure of 3 mmHg.   Cardiac CT: LAD eccentric calcified plaque less than 50% Circumflex no significant stenosis RCA motion artifact throughout precludes accurate assessment Left main no significant narrowing  Cardiac  monitor 05/04/2020 personally reviewed Max 187 bpm 07:27pm, 07/18 Min 52 bpm 05:22am, 07/18 Avg 73 bpm 1% PACs 13.4% PVCs 26 VT runs, longest 5 beats 9 SVT runs noted, longest 27 seconds at 116 bpm Triggered events associated with sinus rhythm, PVCs, SVT   ASSESSMENT AND PLAN:  1.  Paroxysmal atrial fibrillation/atypical atrial flutter: Currently on Xarelto.  Status post cryoablation.  CHA2DS2-VASc of 2.  Unfortunately she is in atrial flutter today.  She feels like her burden has gone up.  Due to that, we Yoshiaki Kreuser plan for ablation.  We Jamesen Stahnke start diltiazem 180 mg in the short-term for rate control.  Risk, benefits, and alternatives to EP study and radiofrequency ablation for afib were also discussed in detail today. These risks include but are not limited to stroke, bleeding, vascular damage, tamponade,  perforation, damage to the esophagus, lungs, and other structures, pulmonary vein stenosis, worsening renal function, and death. The patient understands these risk and wishes to proceed.  We Lakshmi Sundeen therefore proceed with catheter ablation at the next available time.  Carto, ICE, anesthesia are requested for the procedure.  Merrel Crabbe also obtain CT PV protocol prior to the procedure to exclude LAA thrombus and further evaluate atrial anatomy.   2.  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  13% burden.  Has intermittent palpitations but otherwise feels fine.  No changes.  3.  Secondary hypercoagulable state: Currently on Xarelto for atrial fibrillation as above.  Current medicines are reviewed at length with the patient today.   The patient does not have concerns regarding her medicines.  The following changes were made today: Start diltiazem  Labs/ tests ordered today include:  Orders Placed This Encounter  Procedures   CT CARDIAC MORPH/PULM VEIN W/CM&W/O CA SCORE   Basic metabolic panel   CBC   EKG 12-Lead      Disposition:   FU with Milanya Sunderland 3 months  Signed, Shahida Schnackenberg Meredith Leeds, MD   03/21/2022 12:33 PM     [place] [address] [place] [address] [phone] (office) [phone] (fax)

## 2022-03-21 NOTE — Patient Instructions (Addendum)
Medication Instructions:  Your physician has recommended you make the following change in your medication: START Diltiazem 180 mg once daily  *If you need a refill on your cardiac medications before your next appointment, please call your pharmacy*   Lab Work: Pre procedure labs -- see procedure instruction letter:  BMP & CBC  If you have labs (blood work) drawn today and your tests are completely normal, you will receive your results only by: [place] (if you have MyChart) OR A paper copy in the mail If you have any lab test that is abnormal or we need to change your treatment, we will call you to review the results.   Testing/Procedures: Your physician has requested that you have cardiac CT within 7 days PRIOR to your ablation. Cardiac computed tomography (CT) is a painless test that uses an x-ray machine to take clear, detailed pictures of your heart.  Please follow instruction below located under "other instructions". You will get a call from our office to schedule the date for this test.  Your physician has recommended that you have an ablation. Catheter ablation is a medical procedure used to treat some cardiac arrhythmias (irregular heartbeats). During catheter ablation, a long, thin, flexible tube is put into a blood vessel in your groin (upper thigh), or neck. This tube is called an ablation catheter. It is then guided to your heart through the blood vessel. Radio frequency waves destroy small areas of heart tissue where abnormal heartbeats may cause an arrhythmia to start. Please follow instruction letter given to you today.   Follow-Up: At [facility], you and your health needs are our priority.  As part of our continuing mission to provide you with exceptional heart care, we have created designated Provider Care Teams.  These Care Teams include your primary Cardiologist (physician) and Advanced Practice Providers (APPs -  Physician Assistants and Nurse Practitioners) who  all work together to provide you with the care you need, when you need it.   Your next appointment:   1 month(s) after your ablation  The format for your next appointment:   In Person  Provider:   AFib clinic   Thank you for choosing [facility]!!   Trinidad Curet, RN [phone]    Other Instructions                              Cardiac Ablation Cardiac ablation is a procedure to destroy (ablate) some heart tissue that is sending bad signals. These bad signals cause problems in heart rhythm. The heart has many areas that make these signals. If there are problems in these areas, they can make the heart beat in a way that is not normal. Destroying some tissues can help make the heart rhythm normal. Tell your doctor about: Any allergies you have. All medicines you are taking. These include vitamins, herbs, eye drops, creams, and over-the-counter medicines. Any problems you or family members have had with medicines that make you fall asleep (anesthetics). Any blood disorders you have. Any surgeries you have had. Any medical conditions you have, such as kidney failure. Whether you are pregnant or may be pregnant. What are the risks? This is a safe procedure. But problems may occur, including: Infection. Bruising and bleeding. Bleeding into the chest. Stroke or blood clots. Damage to nearby areas of your body. Allergies to medicines or dyes. The need for a pacemaker if the normal system is damaged. Failure of  the procedure to treat the problem. What happens before the procedure? Medicines Ask your doctor about: Changing or stopping your normal medicines. This is important. Taking aspirin and ibuprofen. Do not take these medicines unless your doctor tells you to take them. Taking other medicines, vitamins, herbs, and supplements. General instructions Follow instructions from your doctor about what you cannot eat or drink. Plan to have someone take you home from the  hospital or clinic. If you will be going home right after the procedure, plan to have someone with you for 24 hours. Ask your doctor what steps will be taken to prevent infection. What happens during the procedure?  An IV tube will be put into one of your veins. You will be given a medicine to help you relax. The skin on your neck or groin will be numbed. A cut (incision) will be made in your neck or groin. A needle will be put through your cut and into a large vein. A tube (catheter) will be put into the needle. The tube will be moved to your heart. Dye may be put through the tube. This helps your doctor see your heart. Small devices (electrodes) on the tube will send out signals. A type of energy will be used to destroy some heart tissue. The tube will be taken out. Pressure will be held on your cut. This helps stop bleeding. A bandage will be put over your cut. The exact procedure may vary among doctors and hospitals. What happens after the procedure? You will be watched until you leave the hospital or clinic. This includes checking your heart rate, breathing rate, oxygen, and blood pressure. Your cut will be watched for bleeding. You will need to lie still for a few hours. Do not drive for 24 hours or as long as your doctor tells you. Summary Cardiac ablation is a procedure to destroy some heart tissue. This is done to treat heart rhythm problems. Tell your doctor about any medical conditions you may have. Tell him or her about all medicines you are taking to treat them. This is a safe procedure. But problems may occur. These include infection, bruising, bleeding, and damage to nearby areas of your body. Follow what your doctor tells you about food and drink. You may also be told to change or stop some of your medicines. After the procedure, do not drive for 24 hours or as long as your doctor tells you. This information is not intended to replace advice given to you by your health care  provider. Make sure you discuss any questions you have with your health care provider. Document Revised: 08/21/2019 Document Reviewed: 08/21/2019 Elsevier Patient Education  [place].

## 2022-03-22 ENCOUNTER — Telehealth: Payer: Self-pay | Admitting: Cardiology

## 2022-03-22 ENCOUNTER — Encounter: Payer: Self-pay | Admitting: Cardiology

## 2022-03-22 MED ORDER — DILTIAZEM HCL ER COATED BEADS 120 MG PO CP24: 120.0000 mg | ORAL_CAPSULE | Freq: Every day | ORAL | 3 refills | Status: DC

## 2022-03-22 NOTE — Telephone Encounter (Signed)
Pt aware to decrease Diltiazem to 120 mg once daily, per Dr. Curt Bears. Rx sent to [facility] per pt request.

## 2022-03-22 NOTE — Telephone Encounter (Signed)
Pt c/o medication issue:  1. Name of Medication: diltiazem (CARDIZEM CD) 180 MG 24 hr capsule  2. How are you currently taking this medication (dosage and times per day)? Take 1 capsule (180 mg total) by mouth daily.  3. Are you having a reaction (difficulty breathing--STAT)? no  4. What is your medication issue? Medication is causing her a high hr rate. Patient is asking that the medication dosage be lower. And patient wasn't to know if she can not take medication tonight. Please advise

## 2022-03-27 ENCOUNTER — Telehealth: Payer: Self-pay | Admitting: Cardiology

## 2022-03-27 ENCOUNTER — Ambulatory Visit: Payer: Medicare Other

## 2022-03-27 NOTE — Telephone Encounter (Signed)
She reports HRs still elevated since medication change, even though she understands we decreased dose after SE on higher dose. Reports a "dry bark cough", persistent since starting Diltiazem. As the day goes on the HRs keeping slowly increasing. She is taking it at night. Should she hang in for couple more days to see if better or does MD recommend changing to something else right now? She also is concerned about the extreneded wait for this procedure and wonders should she be referred to someone else outside of [facility] that might could do it sooner.  Aware I will discuss with MD and let her know via mychart. Verbal consent obtained.

## 2022-04-02 ENCOUNTER — Encounter: Payer: Self-pay | Admitting: Cardiology

## 2022-04-07 NOTE — Telephone Encounter (Signed)
I called pt to discuss medication recommendation. She stated that she has tried Toprol before and had terrible side effects, very weak, tired, severe brain fog. This is noted in an o/v with Renee on 06/09/2020. She doesn't want to try it again.   She said on the days she feels more "flutters" she takes a higher dose of Diltiazem that she has on hand (she doesn't remember the dose) and these are the days she mostly has these side effects of the medication.   I advised pt to only take the prescribed dose of Diltiazem '120mg'$  since the higher dose makes her feel bad and she agreed to try this and call us if it doesn't seem to help her "flutters".

## 2022-04-10 ENCOUNTER — Other Ambulatory Visit: Payer: Self-pay

## 2022-04-10 ENCOUNTER — Telehealth: Payer: Self-pay

## 2022-04-10 DIAGNOSIS — I480 Paroxysmal atrial fibrillation: Secondary | ICD-10-CM

## 2022-04-10 NOTE — Telephone Encounter (Signed)
LM for pt letting her know that I have placed a referral for her to see Afib clinic to discuss alternative rate controlling medications.

## 2022-04-11 ENCOUNTER — Encounter (HOSPITAL_COMMUNITY): Payer: Self-pay | Admitting: Physician Assistant

## 2022-04-11 ENCOUNTER — Ambulatory Visit (HOSPITAL_COMMUNITY)
Admission: RE | Admit: 2022-04-11 | Discharge: 2022-04-11 | Disposition: A | Discharge status: Home / Self Care | Payer: Medicare Other | Source: Ambulatory Visit | Attending: Physician Assistant | Admitting: Physician Assistant

## 2022-04-11 VITALS — BP 110/90 | HR 123 | Ht 64.0 in | Wt 168.8 lb

## 2022-04-11 DIAGNOSIS — I493 Ventricular premature depolarization: Secondary | ICD-10-CM | POA: Insufficient documentation

## 2022-04-11 DIAGNOSIS — Z7901 Long term (current) use of anticoagulants: Secondary | ICD-10-CM | POA: Insufficient documentation

## 2022-04-11 DIAGNOSIS — I480 Paroxysmal atrial fibrillation: Secondary | ICD-10-CM | POA: Insufficient documentation

## 2022-04-11 DIAGNOSIS — I4892 Unspecified atrial flutter: Secondary | ICD-10-CM | POA: Insufficient documentation

## 2022-04-11 MED ORDER — DILTIAZEM HCL ER COATED BEADS 120 MG PO CP24: 120.0000 mg | ORAL_CAPSULE | Freq: Every day | ORAL | 3 refills | Status: DC

## 2022-04-11 MED ORDER — AMIODARONE HCL 200 MG PO TABS: 200.0000 mg | ORAL_TABLET | Freq: Two times a day (BID) | ORAL | 6 refills | Status: DC

## 2022-04-11 NOTE — Progress Notes (Signed)
Primary Care Physician: [name], MD Primary Electrophysiologist: Dr Curt Bears Referring Physician: Dr Marlyn Corporal is a 66 y.o. female with a history of MR, PVCs, atrial fibrillation who presents for follow up in the [facility].  The patient was initially diagnosed with atrial fibrillation 2018 and had a cryoablation in 2019. Patient is on Xarelto for a CHADS2VASC score of 2. She was seen by Dr Curt Bears 03/21/22 and was found to be in atrial flutter. She was started on diltiazem and scheduled for ablation.   On follow up today, patient reports that she had done well on diltiazem 120 mg but started feeling poorly again today. She is in rapid atrial flutter again today. She is very fatigued and has "brain fog" when out of rhythm. No bleeding issues on anticoagulation.   Today, she denies symptoms of chest pain, shortness of breath, orthopnea, PND, lower extremity edema, dizziness, presyncope, syncope, snoring, daytime somnolence, bleeding, or neurologic sequela. The patient is tolerating medications without difficulties and is otherwise without complaint today.    Atrial Fibrillation Risk Factors:  she does not have symptoms or diagnosis of sleep apnea. she does not have a history of rheumatic fever. she does not have a history of alcohol use. The patient does not have a history of early familial atrial fibrillation or other arrhythmias.  she has a BMI of Body mass index is 28.97 kg/m.Marland Kitchen Filed Weights   04/11/22 1518  Weight: 76.6 kg    Family History  Problem Relation Age of Onset   CVA Mother    Stroke Mother    Heart disease Mother    Diabetes Mother    Cancer Mother    Cancer Father      Atrial Fibrillation Management history:  Previous antiarrhythmic drugs: sotalol  Previous cardioversions: 2019 Previous ablations: 2019 cyroablation  CHADS2VASC score: 2 Anticoagulation history: Xarelto    Past Medical History:   Diagnosis Date   Long term (current) use of anticoagulants    Mitral valve regurgitation    Palpitations    Paroxysmal atrial fibrillation (HCC)    Pneumonia    HISTORY   Sinusitis, acute    Past Surgical History:  Procedure Laterality Date   CRYOABLATION     heart   HERNIA REPAIR     TONSILLECTOMY     TUBAL LIGATION      Current Outpatient Medications  Medication Sig Dispense Refill   albuterol (VENTOLIN HFA) 108 (90 Base) MCG/ACT inhaler Inhale 2 puffs into the lungs every 6 (six) hours as needed for wheezing or shortness of breath. 8 g 2   amiodarone (PACERONE) 200 MG tablet Take 1 tablet (200 mg total) by mouth 2 (two) times daily. 60 tablet 6   Ascorbic Acid (VITAMIN C PO) Take 1 tablet by mouth daily.     BIOTIN PO Take by mouth daily.     Calcium-Vitamin D-Vitamin K (CALCIUM + D) [phone] MG-UNT-MCG CHEW See admin instructions.     Carboxymethylcellul-Glycerin (REFRESH OPTIVE OP) Apply to eye. 1 vial per day as needed     Cholecalciferol (VITAMIN D-3) 1000 units CAPS Take 1 capsule by mouth daily.     COLLAGEN PO Take by mouth daily.     Glucosamine HCl (GLUCOSAMINE PO) Take 1 capsule by mouth daily.     levalbuterol (XOPENEX HFA) 45 MCG/ACT inhaler 1 puff as needed     Multiple Vitamin (MULTIVITAMIN) capsule Take 1 capsule by mouth daily.  Omega-3 Fatty Acids (FISH OIL PO) Take 1 capsule by mouth daily.      Probiotic Product (PROBIOTIC PO) Take 1 tablet by mouth daily.     rivaroxaban (XARELTO) 20 MG TABS tablet Take 1 tablet (20 mg total) by mouth daily with supper. 30 tablet 5   diltiazem (CARDIZEM CD) 120 MG 24 hr capsule Take 1 capsule (120 mg total) by mouth daily. 30 capsule 3   No current facility-administered medications for this encounter.    Allergies  Allergen Reactions   Doxycycline Hives, Other (See Comments) and Rash    Other reaction(s): Confusion (intolerance)   Metoprolol    Nitrofurantoin Itching, Other (See Comments) and Swelling     Social History   Socioeconomic History   Marital status: Divorced    Spouse name: Not on file   Number of children: 2   Years of education: Not on file   Highest education level: Not on file  Occupational History   Occupation: RETIRED  Tobacco Use   Smoking status: Former    Packs/day: 1.00    Years: 30.00    Total pack years: 30.00    Types: Cigarettes    Quit date: 11/16/2007    Years since quitting: 14.4   Smokeless tobacco: Never   Tobacco comments:    Former smoker 04/11/22  Vaping Use   Vaping Use: Never used  Substance and Sexual Activity   Alcohol use: Yes    Alcohol/week: 2.0 standard drinks of alcohol    Types: 2 Glasses of wine per week    Comment: 1-2 glasses of wine weekly 04/11/22   Drug use: Never   Sexual activity: Not on file  Other Topics Concern   Not on file  Social History Narrative   Not on file   Social Determinants of Health   Financial Resource Strain: Not on file  Food Insecurity: Not on file  Transportation Needs: Not on file  Physical Activity: Not on file  Stress: Not on file  Social Connections: Not on file  Intimate Partner Violence: Not on file     ROS- All systems are reviewed and negative except as per the HPI above.  Physical Exam: Vitals:   04/11/22 1518  BP: 110/90  Pulse: (!) 123  Weight: 76.6 kg  Height: '5\' 4"'$  (1.626 m)    GEN- The patient is a well appearing female, alert and oriented x 3 today.   Head- normocephalic, atraumatic Eyes-  Sclera clear, conjunctiva pink Ears- hearing intact Oropharynx- clear Neck- supple  Lungs- Clear to ausculation bilaterally, normal work of breathing Heart- irregular rate and rhythm, no murmurs, rubs or gallops  GI- soft, NT, ND, + BS Extremities- no clubbing, cyanosis, or edema MS- no significant deformity or atrophy Skin- no rash or lesion Psych- euthymic mood, full affect Neuro- strength and sensation are intact  Wt Readings from Last 3 Encounters:  04/11/22 76.6  kg  03/21/22 78.2 kg  06/14/21 78 kg    EKG today demonstrates  Atypical atrial flutter with variable block vs coarse afib Vent. rate 123 BPM PR interval * ms QRS duration 64 ms QT/QTcB 272/389 ms  Echo 09/26/20 demonstrated   1. Left ventricular ejection fraction, by estimation, is 65 to 70%. The  left ventricle has normal function. The left ventricle has no regional  wall motion abnormalities. Left ventricular diastolic parameters were  normal.   2. Right ventricular systolic function is normal. The right ventricular  size is normal. There is normal pulmonary  artery systolic pressure.   3. The mitral valve is normal in structure. Mild mitral valve  regurgitation. No evidence of mitral stenosis.   4. The aortic valve is normal in structure. Aortic valve regurgitation is  not visualized. No aortic stenosis is present.   5. The inferior vena cava is normal in size with greater than 50%  respiratory variability, suggesting right atrial pressure of 3 mmHg.   Epic records are reviewed at length today  CHA2DS2-VASc Score = 2  The patient's score is based upon: CHF History: 0 HTN History: 0 Diabetes History: 0 Stroke History: 0 Vascular Disease History: 0 Age Score: 1 Gender Score: 1       ASSESSMENT AND PLAN: 1. Paroxysmal Atrial Fibrillation/atrial flutter The patient's CHA2DS2-VASc score is 2, indicating a 2.2% annual risk of stroke.   Scheduled for ablation 07/18/22 Patient in symptomatic, rapid flutter today. We discussed therapeutic options. Clinically, patient appears paroxysmal with intermittent symptoms, will not pursue DCCV. We discussed using antiarrhythmics as a bridge to ablation. She is agreeable to starting amiodarone 200 mg BID for one month then decreasing to once daily. Continue Xarelto 20 mg daily Continue diltiazem 120 mg daily   Follow up in the AF clinic next week.    [facility] [address] [place], [place] [address] [phone] 04/11/2022 4:10 PM

## 2022-04-11 NOTE — Patient Instructions (Signed)
Start Amiodarone '200mg'$  twice daily

## 2022-04-12 ENCOUNTER — Ambulatory Visit: Payer: Medicare Other | Admitting: Cardiology

## 2022-04-13 ENCOUNTER — Ambulatory Visit
Admission: RE | Admit: 2022-04-13 | Discharge: 2022-04-13 | Disposition: A | Discharge status: Home / Self Care | Payer: Medicare Other | Source: Ambulatory Visit | Attending: Obstetrics and Gynecology | Admitting: Obstetrics and Gynecology

## 2022-04-13 DIAGNOSIS — Z1231 Encounter for screening mammogram for malignant neoplasm of breast: Secondary | ICD-10-CM

## 2022-04-18 ENCOUNTER — Ambulatory Visit (HOSPITAL_COMMUNITY): Payer: Medicare Other | Admitting: Physician Assistant

## 2022-04-18 ENCOUNTER — Encounter (HOSPITAL_COMMUNITY): Payer: Self-pay | Admitting: Physician Assistant

## 2022-04-18 ENCOUNTER — Ambulatory Visit (HOSPITAL_COMMUNITY)
Admission: RE | Admit: 2022-04-18 | Discharge: 2022-04-18 | Disposition: A | Discharge status: Home / Self Care | Payer: Medicare Other | Source: Ambulatory Visit | Attending: Physician Assistant | Admitting: Physician Assistant

## 2022-04-18 VITALS — BP 122/80 | HR 63 | Ht 64.0 in | Wt 171.0 lb

## 2022-04-18 DIAGNOSIS — Z7901 Long term (current) use of anticoagulants: Secondary | ICD-10-CM | POA: Insufficient documentation

## 2022-04-18 DIAGNOSIS — I4892 Unspecified atrial flutter: Secondary | ICD-10-CM | POA: Diagnosis not present

## 2022-04-18 DIAGNOSIS — I480 Paroxysmal atrial fibrillation: Secondary | ICD-10-CM | POA: Insufficient documentation

## 2022-04-18 NOTE — Patient Instructions (Signed)
Decrease amiodarone to '200mg'$  once a day on 05/09/22

## 2022-04-18 NOTE — Progress Notes (Signed)
Primary Care Physician: [name], MD Primary Electrophysiologist: Dr Curt Bears Referring Physician: Dr Marlyn Corporal is a 66 y.o. female with a history of MR, PVCs, atrial fibrillation who presents for follow up in the [facility].  The patient was initially diagnosed with atrial fibrillation 2018 and had a cryoablation in 2019. Patient is on Xarelto for a CHADS2VASC score of 2. She was seen by Dr Curt Bears 03/21/22 and was found to be in atrial flutter. She was started on diltiazem and scheduled for ablation.   On follow up today, patient reports that she has done well since her last visit. She is back in SR. She feels like her heart is "trying to go out of rhythm but can't". She is tolerating the medication without difficulty.   Today, she denies symptoms of palpitations, chest pain, shortness of breath, orthopnea, PND, lower extremity edema, dizziness, presyncope, syncope, snoring, daytime somnolence, bleeding, or neurologic sequela. The patient is tolerating medications without difficulties and is otherwise without complaint today.    Atrial Fibrillation Risk Factors:  she does not have symptoms or diagnosis of sleep apnea. she does not have a history of rheumatic fever. she does not have a history of alcohol use. The patient does not have a history of early familial atrial fibrillation or other arrhythmias.  she has a BMI of Body mass index is 29.35 kg/m.Marland Kitchen Filed Weights   04/18/22 0822  Weight: 77.6 kg    Family History  Problem Relation Age of Onset   CVA Mother    Stroke Mother    Heart disease Mother    Diabetes Mother    Cancer Mother    Cancer Father      Atrial Fibrillation Management history:  Previous antiarrhythmic drugs: sotalol, amiodarone  Previous cardioversions: 2019 Previous ablations: 2019 cyroablation  CHADS2VASC score: 2 Anticoagulation history: Xarelto    Past Medical History:  Diagnosis Date    Long term (current) use of anticoagulants    Mitral valve regurgitation    Palpitations    Paroxysmal atrial fibrillation (HCC)    Pneumonia    HISTORY   Sinusitis, acute    Past Surgical History:  Procedure Laterality Date   CRYOABLATION     heart   HERNIA REPAIR     TONSILLECTOMY     TUBAL LIGATION      Current Outpatient Medications  Medication Sig Dispense Refill   albuterol (VENTOLIN HFA) 108 (90 Base) MCG/ACT inhaler Inhale 2 puffs into the lungs every 6 (six) hours as needed for wheezing or shortness of breath. 8 g 2   amiodarone (PACERONE) 200 MG tablet Take 1 tablet (200 mg total) by mouth 2 (two) times daily. 60 tablet 6   Ascorbic Acid (VITAMIN C PO) Take 1 tablet by mouth daily.     BIOTIN PO Take by mouth daily.     Calcium-Vitamin D-Vitamin K (CALCIUM + D) [phone] MG-UNT-MCG CHEW See admin instructions.     Carboxymethylcellul-Glycerin (REFRESH OPTIVE OP) Apply to eye. 1 vial per day as needed     Cholecalciferol (VITAMIN D-3) 1000 units CAPS Take 1 capsule by mouth daily.     COLLAGEN PO Take by mouth daily.     diltiazem (CARDIZEM CD) 120 MG 24 hr capsule Take 1 capsule (120 mg total) by mouth daily. 30 capsule 3   Glucosamine HCl (GLUCOSAMINE PO) Take 1 capsule by mouth daily.     levalbuterol (XOPENEX HFA) 45 MCG/ACT inhaler 1 puff  as needed     Multiple Vitamin (MULTIVITAMIN) capsule Take 1 capsule by mouth daily.     Omega-3 Fatty Acids (FISH OIL PO) Take 1 capsule by mouth daily.      Probiotic Product (PROBIOTIC PO) Take 1 tablet by mouth daily.     rivaroxaban (XARELTO) 20 MG TABS tablet Take 1 tablet (20 mg total) by mouth daily with supper. 30 tablet 5   No current facility-administered medications for this encounter.    Allergies  Allergen Reactions   Doxycycline Hives, Other (See Comments) and Rash    Other reaction(s): Confusion (intolerance)   Metoprolol    Nitrofurantoin Itching, Other (See Comments) and Swelling    Social History    Socioeconomic History   Marital status: Divorced    Spouse name: Not on file   Number of children: 2   Years of education: Not on file   Highest education level: Not on file  Occupational History   Occupation: RETIRED  Tobacco Use   Smoking status: Former    Packs/day: 1.00    Years: 30.00    Total pack years: 30.00    Types: Cigarettes    Quit date: 11/16/2007    Years since quitting: 14.4   Smokeless tobacco: Never   Tobacco comments:    Former smoker 04/11/22  Vaping Use   Vaping Use: Never used  Substance and Sexual Activity   Alcohol use: Yes    Alcohol/week: 2.0 standard drinks of alcohol    Types: 2 Glasses of wine per week    Comment: 1-2 glasses of wine weekly 04/11/22   Drug use: Never   Sexual activity: Not on file  Other Topics Concern   Not on file  Social History Narrative   Not on file   Social Determinants of Health   Financial Resource Strain: Not on file  Food Insecurity: Not on file  Transportation Needs: Not on file  Physical Activity: Not on file  Stress: Not on file  Social Connections: Not on file  Intimate Partner Violence: Not on file     ROS- All systems are reviewed and negative except as per the HPI above.  Physical Exam: Vitals:   04/18/22 0822  BP: 122/80  Pulse: 63  Weight: 77.6 kg  Height: '5\' 4"'$  (1.626 m)     GEN- The patient is a well appearing female, alert and oriented x 3 today.   HEENT-head normocephalic, atraumatic, sclera clear, conjunctiva pink, hearing intact, trachea midline. Lungs- Clear to ausculation bilaterally, normal work of breathing Heart- Regular rate and rhythm, no murmurs, rubs or gallops  GI- soft, NT, ND, + BS Extremities- no clubbing, cyanosis, or edema MS- no significant deformity or atrophy Skin- no rash or lesion Psych- euthymic mood, full affect Neuro- strength and sensation are intact   Wt Readings from Last 3 Encounters:  04/18/22 77.6 kg  04/11/22 76.6 kg  03/21/22 78.2 kg     EKG today demonstrates  SR Vent. rate 63 BPM PR interval 188 ms QRS duration 76 ms QT/QTcB 438/448 ms  Echo 09/26/20 demonstrated   1. Left ventricular ejection fraction, by estimation, is 65 to 70%. The  left ventricle has normal function. The left ventricle has no regional  wall motion abnormalities. Left ventricular diastolic parameters were  normal.   2. Right ventricular systolic function is normal. The right ventricular  size is normal. There is normal pulmonary artery systolic pressure.   3. The mitral valve is normal in structure. Mild mitral  valve  regurgitation. No evidence of mitral stenosis.   4. The aortic valve is normal in structure. Aortic valve regurgitation is  not visualized. No aortic stenosis is present.   5. The inferior vena cava is normal in size with greater than 50%  respiratory variability, suggesting right atrial pressure of 3 mmHg.   Epic records are reviewed at length today  CHA2DS2-VASc Score = 2  The patient's score is based upon: CHF History: 0 HTN History: 0 Diabetes History: 0 Stroke History: 0 Vascular Disease History: 0 Age Score: 1 Gender Score: 1       ASSESSMENT AND PLAN: 1. Paroxysmal Atrial Fibrillation/atrial flutter The patient's CHA2DS2-VASc score is 2, indicating a 2.2% annual risk of stroke.   Scheduled for ablation 07/18/22 She is in SR today, DCCV not neccessary.  Continue amiodarone 200 mg BID x 3 weeks then decrease to once daily as a bridge to ablation.  Continue Xarelto 20 mg daily Continue diltiazem 120 mg daily   Follow up in the AF clinic in 6 weeks.    [facility] [address] [place], [place] [address] [phone] 04/18/2022 8:28 AM

## 2022-05-24 ENCOUNTER — Other Ambulatory Visit (HOSPITAL_COMMUNITY): Payer: Self-pay | Admitting: Physician Assistant

## 2022-05-31 ENCOUNTER — Encounter (HOSPITAL_COMMUNITY): Payer: Self-pay | Admitting: Physician Assistant

## 2022-05-31 ENCOUNTER — Ambulatory Visit (HOSPITAL_COMMUNITY)
Admission: RE | Admit: 2022-05-31 | Discharge: 2022-05-31 | Disposition: A | Discharge status: Home / Self Care | Payer: Medicare Other | Source: Ambulatory Visit | Attending: Physician Assistant | Admitting: Physician Assistant

## 2022-05-31 VITALS — BP 128/80 | HR 67 | Ht 64.0 in | Wt 174.0 lb

## 2022-05-31 DIAGNOSIS — Z7901 Long term (current) use of anticoagulants: Secondary | ICD-10-CM | POA: Insufficient documentation

## 2022-05-31 DIAGNOSIS — R600 Localized edema: Secondary | ICD-10-CM | POA: Diagnosis not present

## 2022-05-31 DIAGNOSIS — I4892 Unspecified atrial flutter: Secondary | ICD-10-CM | POA: Diagnosis not present

## 2022-05-31 DIAGNOSIS — I480 Paroxysmal atrial fibrillation: Secondary | ICD-10-CM | POA: Diagnosis present

## 2022-05-31 DIAGNOSIS — I493 Ventricular premature depolarization: Secondary | ICD-10-CM | POA: Diagnosis not present

## 2022-05-31 LAB — COMPREHENSIVE METABOLIC PANEL
ALT: 22 U/L (ref 0–44)
AST: 24 U/L (ref 15–41)
Albumin: 3.9 g/dL (ref 3.5–5.0)
Alkaline Phosphatase: 54 U/L (ref 38–126)
Anion gap: 8 (ref 5–15)
BUN: 18 mg/dL (ref 8–23)
CO2: 25 mmol/L (ref 22–32)
Calcium: 9 mg/dL (ref 8.9–10.3)
Chloride: 109 mmol/L (ref 98–111)
Creatinine, Ser: 0.99 mg/dL (ref 0.44–1.00)
GFR, Estimated: >60 mL/min (ref >60)
Glucose, Bld: 85 mg/dL (ref 70–99)
Potassium: 4 mmol/L (ref 3.5–5.1)
Sodium: 142 mmol/L (ref 135–145)
Total Bilirubin: 0.5 mg/dL (ref 0.3–1.2)
Total Protein: 6.9 g/dL (ref 6.5–8.1)

## 2022-05-31 LAB — T4, FREE: Free T4: 0.82 ng/dL (ref 0.61–1.12)

## 2022-05-31 LAB — TSH: TSH: 6.161 u[IU]/mL — ABNORMAL HIGH (ref 0.350–4.500)

## 2022-05-31 LAB — BRAIN NATRIURETIC PEPTIDE: B Natriuretic Peptide: 113.6 pg/mL — ABNORMAL HIGH (ref 0.0–100.0)

## 2022-05-31 MED ORDER — AMIODARONE HCL 200 MG PO TABS: 200.0000 mg | ORAL_TABLET | Freq: Every day | ORAL | 6 refills | Status: DC

## 2022-05-31 NOTE — Addendum Note (Signed)
Encounter addended by: Juluis Mire, RN on: 05/31/2022 12:11 PM  Actions taken: Order list changed, Diagnosis association updated

## 2022-05-31 NOTE — Progress Notes (Signed)
Primary Care Physician: [name], MD Primary Electrophysiologist: Dr Curt Bears Referring Physician: Dr Marlyn Corporal is a 66 y.o. female with a history of MR, PVCs, atrial fibrillation who presents for follow up in the [facility].  The patient was initially diagnosed with atrial fibrillation 2018 and had a cryoablation in 2019. Patient is on Xarelto for a CHADS2VASC score of 2. She was seen by Dr Curt Bears 03/21/22 and was found to be in atrial flutter. She was started on diltiazem and scheduled for ablation 07/18/22.   On follow up today, patient reports that she has done reasonably well since her last visit. She did have one episode of afib lasting about two hours. She has noted her weight had increased, she is up about 4 lbs. She does admit to not being very active. She has chronic lower extremity edema.   Today, she denies symptoms of palpitations, chest pain, shortness of breath, orthopnea, PND, lower extremity edema, dizziness, presyncope, syncope, snoring, daytime somnolence, bleeding, or neurologic sequela. The patient is tolerating medications without difficulties and is otherwise without complaint today.    Atrial Fibrillation Risk Factors:  she does not have symptoms or diagnosis of sleep apnea. she does not have a history of rheumatic fever. she does not have a history of alcohol use. The patient does not have a history of early familial atrial fibrillation or other arrhythmias.  she has a BMI of Body mass index is 29.87 kg/m.Marland Kitchen Filed Weights   05/31/22 0958  Weight: 78.9 kg    Family History  Problem Relation Age of Onset   CVA Mother    Stroke Mother    Heart disease Mother    Diabetes Mother    Cancer Mother    Cancer Father      Atrial Fibrillation Management history:  Previous antiarrhythmic drugs: sotalol, amiodarone  Previous cardioversions: 2019 Previous ablations: 2019 cyroablation  CHADS2VASC score:  2 Anticoagulation history: Xarelto    Past Medical History:  Diagnosis Date   Long term (current) use of anticoagulants    Mitral valve regurgitation    Palpitations    Paroxysmal atrial fibrillation (HCC)    Pneumonia    HISTORY   Sinusitis, acute    Past Surgical History:  Procedure Laterality Date   CRYOABLATION     heart   HERNIA REPAIR     TONSILLECTOMY     TUBAL LIGATION      Current Outpatient Medications  Medication Sig Dispense Refill   albuterol (VENTOLIN HFA) 108 (90 Base) MCG/ACT inhaler Inhale 2 puffs into the lungs every 6 (six) hours as needed for wheezing or shortness of breath. 8 g 2   Ascorbic Acid (VITAMIN C PO) Take 1 tablet by mouth daily.     BIOTIN PO Take by mouth daily.     Calcium-Vitamin D-Vitamin K (CALCIUM + D) [phone] MG-UNT-MCG CHEW See admin instructions.     Carboxymethylcellul-Glycerin (REFRESH OPTIVE OP) Apply to eye. 1 vial per day as needed     Cholecalciferol (VITAMIN D-3) 1000 units CAPS Take 1 capsule by mouth daily.     COLLAGEN PO Take by mouth daily.     diltiazem (CARDIZEM CD) 120 MG 24 hr capsule TAKE 1 CAPSULE BY MOUTH EVERY DAY 30 capsule 3   Glucosamine HCl (GLUCOSAMINE PO) Take 1 capsule by mouth daily.     levalbuterol (XOPENEX HFA) 45 MCG/ACT inhaler 1 puff as needed     Multiple Vitamin (MULTIVITAMIN) capsule  Take 1 capsule by mouth daily.     Omega-3 Fatty Acids (FISH OIL PO) Take 1 capsule by mouth daily.      Probiotic Product (PROBIOTIC PO) Take 1 tablet by mouth daily.     rivaroxaban (XARELTO) 20 MG TABS tablet Take 1 tablet (20 mg total) by mouth daily with supper. 30 tablet 5   amiodarone (PACERONE) 200 MG tablet Take 1 tablet (200 mg total) by mouth daily. 60 tablet 6   No current facility-administered medications for this encounter.    Allergies  Allergen Reactions   Doxycycline Hives, Other (See Comments) and Rash    Other reaction(s): Confusion (intolerance)   Metoprolol    Nitrofurantoin Itching,  Other (See Comments) and Swelling    Social History   Socioeconomic History   Marital status: Divorced    Spouse name: Not on file   Number of children: 2   Years of education: Not on file   Highest education level: Not on file  Occupational History   Occupation: RETIRED  Tobacco Use   Smoking status: Former    Packs/day: 1.00    Years: 30.00    Total pack years: 30.00    Types: Cigarettes    Quit date: 11/16/2007    Years since quitting: 14.5   Smokeless tobacco: Never   Tobacco comments:    Former smoker 04/11/22  Vaping Use   Vaping Use: Never used  Substance and Sexual Activity   Alcohol use: Yes    Alcohol/week: 2.0 standard drinks of alcohol    Types: 2 Glasses of wine per week    Comment: 1-2 glasses of wine weekly 04/11/22   Drug use: Never   Sexual activity: Not on file  Other Topics Concern   Not on file  Social History Narrative   Not on file   Social Determinants of Health   Financial Resource Strain: Not on file  Food Insecurity: Not on file  Transportation Needs: Not on file  Physical Activity: Not on file  Stress: Not on file  Social Connections: Not on file  Intimate Partner Violence: Not on file     ROS- All systems are reviewed and negative except as per the HPI above.  Physical Exam: Vitals:   05/31/22 0958  BP: 128/80  Pulse: 67  Weight: 78.9 kg  Height: '5\' 4"'$  (1.626 m)     GEN- The patient is a well appearing female, alert and oriented x 3 today.   HEENT-head normocephalic, atraumatic, sclera clear, conjunctiva pink, hearing intact, trachea midline. Lungs- Clear to ausculation bilaterally, normal work of breathing Heart- Regular rate and rhythm, no murmurs, rubs or gallops  GI- soft, NT, ND, + BS Extremities- no clubbing, cyanosis, bilateral non pitting edema MS- no significant deformity or atrophy Skin- no rash or lesion Psych- euthymic mood, full affect Neuro- strength and sensation are intact   Wt Readings from Last 3  Encounters:  05/31/22 78.9 kg  04/18/22 77.6 kg  04/11/22 76.6 kg    EKG today demonstrates  SR Vent. rate 67 BPM PR interval 188 ms QRS duration 76 ms QT/QTcB 442/467 ms  Echo 09/26/20 demonstrated   1. Left ventricular ejection fraction, by estimation, is 65 to 70%. The  left ventricle has normal function. The left ventricle has no regional  wall motion abnormalities. Left ventricular diastolic parameters were  normal.   2. Right ventricular systolic function is normal. The right ventricular  size is normal. There is normal pulmonary artery systolic pressure.  3. The mitral valve is normal in structure. Mild mitral valve  regurgitation. No evidence of mitral stenosis.   4. The aortic valve is normal in structure. Aortic valve regurgitation is  not visualized. No aortic stenosis is present.   5. The inferior vena cava is normal in size with greater than 50%  respiratory variability, suggesting right atrial pressure of 3 mmHg.   Epic records are reviewed at length today  CHA2DS2-VASc Score = 2  The patient's score is based upon: CHF History: 0 HTN History: 0 Diabetes History: 0 Stroke History: 0 Vascular Disease History: 0 Age Score: 1 Gender Score: 1       ASSESSMENT AND PLAN: 1. Paroxysmal Atrial Fibrillation/atrial flutter The patient's CHA2DS2-VASc score is 2, indicating a 2.2% annual risk of stroke.   Scheduled for ablation 07/18/22 Patient in [place]. Continue amiodarone 200 mg daily Check cmet/TSH. Will also check BNP to help differentiate between fluid retention and true weight gain.  Continue Xarelto 20 mg daily Continue diltiazem 120 mg daily   Follow up with Dr Curt Bears as scheduled for ablation.    [facility] [address] [place], [place] [address] [phone] 05/31/2022 10:31 AM

## 2022-06-01 ENCOUNTER — Other Ambulatory Visit (HOSPITAL_COMMUNITY): Payer: Self-pay | Admitting: *Deleted

## 2022-06-01 DIAGNOSIS — I4819 Other persistent atrial fibrillation: Secondary | ICD-10-CM

## 2022-06-01 MED ORDER — AMIODARONE HCL 200 MG PO TABS: 100.0000 mg | ORAL_TABLET | Freq: Every day | ORAL | 6 refills | Status: DC

## 2022-06-01 NOTE — Addendum Note (Signed)
Encounter addended by: Oliver Barre, PA on: 06/01/2022 9:44 AM  Actions taken: Clinical Note Signed

## 2022-06-18 ENCOUNTER — Ambulatory Visit (HOSPITAL_COMMUNITY): Payer: Self-pay

## 2022-06-21 ENCOUNTER — Ambulatory Visit (INDEPENDENT_AMBULATORY_CARE_PROVIDER_SITE_OTHER): Payer: Medicare Other | Admitting: Podiatry

## 2022-06-21 ENCOUNTER — Encounter: Payer: Self-pay | Admitting: *Deleted

## 2022-06-21 ENCOUNTER — Telehealth: Payer: Self-pay | Admitting: *Deleted

## 2022-06-21 ENCOUNTER — Encounter: Payer: Self-pay | Admitting: Podiatry

## 2022-06-21 DIAGNOSIS — M722 Plantar fascial fibromatosis: Secondary | ICD-10-CM | POA: Diagnosis not present

## 2022-06-21 NOTE — Telephone Encounter (Signed)
Pt aware of a scheduling change at the hospital for 10/17. Aware procedure has been moved to 10/20, pt agreeable. Aware all instructions remain  the same except new procedure date. Aware I will send an updated ablation instruction sheet via mychart. Patient verbalized understanding and agreeable to plan.

## 2022-06-22 ENCOUNTER — Encounter: Payer: Self-pay | Admitting: Cardiology

## 2022-06-22 NOTE — Progress Notes (Signed)
Subjective:   Patient ID: Deborah Ortiz, female   DOB: 66 y.o.   MRN: [id number]   HPI Patient presents stating she is here for orthotic she does have a significant limb length discrepancy with the right leg being shorter and has had chronic history of plantar fascial inflammation with moderate discomfort noted currently.  She is due to have an ablation done at the beginning of October for A-fib and is on medications   ROS      Objective:  Physical Exam  Neurovascular status found to be intact muscle strength was found to be adequate currently with patient found to have significant pain still in the plantar fascia right that is been bothersome but she does not want to treat until her A-fib is treated.  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  Patient will be seen back when ready and we also will have to consider long-term the treatment for the plantar fasciitis but we will get a kind to see how it responds and then decide what might need to be done

## 2022-06-22 NOTE — Telephone Encounter (Signed)
Just FYI--Patient following up. Again, states she was able to arrange transportation and is agreeable with procedure being rescheduled for 10/20.

## 2022-07-03 ENCOUNTER — Encounter (HOSPITAL_COMMUNITY): Payer: Self-pay

## 2022-07-04 ENCOUNTER — Ambulatory Visit: Payer: Medicare Other | Attending: Cardiology

## 2022-07-04 DIAGNOSIS — Z79899 Other long term (current) drug therapy: Secondary | ICD-10-CM

## 2022-07-04 DIAGNOSIS — I480 Paroxysmal atrial fibrillation: Secondary | ICD-10-CM

## 2022-07-04 DIAGNOSIS — I4819 Other persistent atrial fibrillation: Secondary | ICD-10-CM

## 2022-07-05 LAB — BASIC METABOLIC PANEL
BUN/Creatinine Ratio: 27 (ref 12–28)
BUN: 22 mg/dL (ref 8–27)
CO2: 24 mmol/L (ref 20–29)
Calcium: 9.6 mg/dL (ref 8.7–10.3)
Chloride: 104 mmol/L (ref 96–106)
Creatinine, Ser: 0.81 mg/dL (ref 0.57–1.00)
Glucose: 101 mg/dL — ABNORMAL HIGH (ref 70–99)
Potassium: 4.2 mmol/L (ref 3.5–5.2)
Sodium: 144 mmol/L (ref 134–144)
eGFR: 79 mL/min/{1.73_m2} (ref >59)

## 2022-07-05 LAB — CBC
Hematocrit: 40.8 % (ref 34.0–46.6)
Hemoglobin: 13.6 g/dL (ref 11.1–15.9)
MCH: 31.6 pg (ref 26.6–33.0)
MCHC: 33.3 g/dL (ref 31.5–35.7)
MCV: 95 fL (ref 79–97)
Platelets: 195 10*3/uL (ref 150–450)
RBC: 4.31 x10E6/uL (ref 3.77–5.28)
RDW: 13.7 % (ref 11.7–15.4)
WBC: 6.6 10*3/uL (ref 3.4–10.8)

## 2022-07-06 ENCOUNTER — Other Ambulatory Visit: Payer: Self-pay

## 2022-07-06 DIAGNOSIS — I480 Paroxysmal atrial fibrillation: Secondary | ICD-10-CM

## 2022-07-06 MED ORDER — RIVAROXABAN 20 MG PO TABS: 20.0000 mg | ORAL_TABLET | Freq: Every day | ORAL | 5 refills | Status: DC

## 2022-07-06 NOTE — Telephone Encounter (Signed)
Prescription refill request for Xarelto received.  Indication:Afib Last office visit:8/23 Weight:78.9 kg Age:66 Scr:0.8 CrCl:98.63 ml/min  Prescription refilled

## 2022-07-10 ENCOUNTER — Telehealth (HOSPITAL_COMMUNITY): Payer: Self-pay | Admitting: *Deleted

## 2022-07-10 NOTE — Telephone Encounter (Signed)
Reaching out to patient to offer assistance regarding upcoming cardiac imaging study; pt verbalizes understanding of appt date/time, parking situation and where to check in, and verified current allergies; name and call back number provided for further questions should they arise  Ayn Domangue RN Navigator Cardiac Imaging [facility] [phone] office [phone] cell  

## 2022-07-11 ENCOUNTER — Encounter (HOSPITAL_BASED_OUTPATIENT_CLINIC_OR_DEPARTMENT_OTHER): Payer: Self-pay

## 2022-07-11 ENCOUNTER — Ambulatory Visit (HOSPITAL_BASED_OUTPATIENT_CLINIC_OR_DEPARTMENT_OTHER)
Admission: RE | Admit: 2022-07-11 | Discharge: 2022-07-11 | Disposition: A | Discharge status: Home / Self Care | Payer: Medicare Other | Source: Ambulatory Visit | Attending: Cardiology | Admitting: Cardiology

## 2022-07-11 DIAGNOSIS — I4819 Other persistent atrial fibrillation: Secondary | ICD-10-CM | POA: Insufficient documentation

## 2022-07-11 MED ORDER — IOHEXOL 350 MG/ML SOLN
100.0000 mL | Freq: Once | INTRAVENOUS | Status: AC | PRN
  Administered 2022-07-11: 80 mL via INTRAVENOUS

## 2022-07-11 NOTE — ED Provider Notes (Signed)
I was called to CT scanner because patient was having a concern for allergic reaction from CT contrast.  She had lip tingling, difficulty swallowing cough. By the time I arrived, patient was endorsing resolution of symptoms.  While no evidence of acute anaphylaxis, given diffuse nature symptoms, I recommended patient check into the emergency department for ongoing care and management.  She declined at this time and plans to return with any ongoing symptoms.     Tretha Sciara, MD 07/11/22 [phone]

## 2022-07-19 ENCOUNTER — Telehealth: Payer: Self-pay | Admitting: Podiatry

## 2022-07-19 ENCOUNTER — Telehealth: Payer: Self-pay | Admitting: Cardiology

## 2022-07-19 NOTE — Telephone Encounter (Signed)
Patient calling to speak with Sherri to discuss questions about her ablation on Friday.

## 2022-07-19 NOTE — Telephone Encounter (Signed)
Spoke with the patient and answered her questions in regards to her CT scan that was done for her ablation. I have also answered her questions in regards to her ablation on Friday. She will call back if she has anything further.

## 2022-07-19 NOTE — Telephone Encounter (Signed)
Patient stated will call back next week after getting information about her surgery to sch. A pick up time for orthotics.

## 2022-07-20 NOTE — Pre-Procedure Instructions (Signed)
Instructed patient on the following items: Arrival time 1100 Nothing to eat or drink after midnight No meds AM of procedure Responsible person to drive you home and stay with you for 24 hrs  Have you missed any doses of anti-coagulant Xarelto- hasn't missed any doses    

## 2022-07-21 ENCOUNTER — Ambulatory Visit (HOSPITAL_BASED_OUTPATIENT_CLINIC_OR_DEPARTMENT_OTHER): Payer: Medicare Other | Admitting: Anesthesiology

## 2022-07-21 ENCOUNTER — Ambulatory Visit (HOSPITAL_COMMUNITY): Payer: Medicare Other | Admitting: Anesthesiology

## 2022-07-21 ENCOUNTER — Ambulatory Visit (HOSPITAL_COMMUNITY)
Admission: RE | Admit: 2022-07-21 | Discharge: 2022-07-21 | Disposition: A | Discharge status: Home / Self Care | Payer: Medicare Other | Source: Ambulatory Visit | Attending: Cardiology | Admitting: Cardiology

## 2022-07-21 ENCOUNTER — Encounter (HOSPITAL_COMMUNITY)
Admission: RE | Disposition: A | Discharge status: Home / Self Care | Payer: Medicare Other | Source: Ambulatory Visit | Attending: Cardiology

## 2022-07-21 ENCOUNTER — Other Ambulatory Visit: Payer: Self-pay

## 2022-07-21 ENCOUNTER — Encounter (HOSPITAL_COMMUNITY): Payer: Self-pay | Admitting: Cardiology

## 2022-07-21 DIAGNOSIS — I493 Ventricular premature depolarization: Secondary | ICD-10-CM | POA: Diagnosis not present

## 2022-07-21 DIAGNOSIS — I4891 Unspecified atrial fibrillation: Secondary | ICD-10-CM

## 2022-07-21 DIAGNOSIS — Z87891 Personal history of nicotine dependence: Secondary | ICD-10-CM | POA: Diagnosis not present

## 2022-07-21 DIAGNOSIS — I4819 Other persistent atrial fibrillation: Secondary | ICD-10-CM | POA: Insufficient documentation

## 2022-07-21 DIAGNOSIS — I484 Atypical atrial flutter: Secondary | ICD-10-CM | POA: Insufficient documentation

## 2022-07-21 DIAGNOSIS — Z7901 Long term (current) use of anticoagulants: Secondary | ICD-10-CM | POA: Insufficient documentation

## 2022-07-21 HISTORY — PX: ATRIAL FIBRILLATION ABLATION: EP1191

## 2022-07-21 LAB — POCT ACTIVATED CLOTTING TIME: Activated Clotting Time: 365 seconds

## 2022-07-21 SURGERY — ATRIAL FIBRILLATION ABLATION
Anesthesia: General

## 2022-07-21 MED ORDER — DOBUTAMINE INFUSION FOR EP/ECHO/NUC (1000 MCG/ML)
INTRAVENOUS | Status: DC | PRN
  Administered 2022-07-21: 20 ug/kg/min via INTRAVENOUS

## 2022-07-21 MED ORDER — HEPARIN SODIUM (PORCINE) 1000 UNIT/ML IJ SOLN
INTRAMUSCULAR | Status: DC | PRN
  Administered 2022-07-21: 1000 [IU] via INTRAVENOUS

## 2022-07-21 MED ORDER — HEPARIN (PORCINE) IN NACL 1000-0.9 UT/500ML-% IV SOLN
INTRAVENOUS | Status: AC
  Filled 2022-07-21: qty 500

## 2022-07-21 MED ORDER — SUGAMMADEX SODIUM 200 MG/2ML IV SOLN
INTRAVENOUS | Status: DC | PRN
  Administered 2022-07-21: 200 mg via INTRAVENOUS

## 2022-07-21 MED ORDER — ONDANSETRON HCL 4 MG/2ML IJ SOLN
INTRAMUSCULAR | Status: DC | PRN
  Administered 2022-07-21: 4 mg via INTRAVENOUS

## 2022-07-21 MED ORDER — DEXAMETHASONE SODIUM PHOSPHATE 10 MG/ML IJ SOLN
INTRAMUSCULAR | Status: DC | PRN
  Administered 2022-07-21: 10 mg via INTRAVENOUS

## 2022-07-21 MED ORDER — HEPARIN SODIUM (PORCINE) 1000 UNIT/ML IJ SOLN
INTRAMUSCULAR | Status: AC
Start: 2022-07-21 — End: ?
  Filled 2022-07-21: qty 10

## 2022-07-21 MED ORDER — SODIUM CHLORIDE 0.9% FLUSH: 3.0000 mL | Freq: Two times a day (BID) | INTRAVENOUS | Status: DC

## 2022-07-21 MED ORDER — ONDANSETRON HCL 4 MG/2ML IJ SOLN: 4.0000 mg | Freq: Four times a day (QID) | INTRAMUSCULAR | Status: DC | PRN

## 2022-07-21 MED ORDER — SODIUM CHLORIDE 0.9% FLUSH: 3.0000 mL | INTRAVENOUS | Status: DC | PRN

## 2022-07-21 MED ORDER — HEPARIN SODIUM (PORCINE) 1000 UNIT/ML IJ SOLN
INTRAMUSCULAR | Status: DC | PRN
  Administered 2022-07-21: 14000 [IU] via INTRAVENOUS

## 2022-07-21 MED ORDER — LIDOCAINE 2% (20 MG/ML) 5 ML SYRINGE
INTRAMUSCULAR | Status: DC | PRN
  Administered 2022-07-21: 60 mg via INTRAVENOUS

## 2022-07-21 MED ORDER — EPHEDRINE SULFATE-NACL 50-0.9 MG/10ML-% IV SOSY
PREFILLED_SYRINGE | INTRAVENOUS | Status: DC | PRN
  Administered 2022-07-21 (×2): 10 mg via INTRAVENOUS

## 2022-07-21 MED ORDER — ROCURONIUM BROMIDE 10 MG/ML (PF) SYRINGE
PREFILLED_SYRINGE | INTRAVENOUS | Status: DC | PRN
  Administered 2022-07-21: 70 mg via INTRAVENOUS

## 2022-07-21 MED ORDER — DOBUTAMINE INFUSION FOR EP/ECHO/NUC (1000 MCG/ML)
INTRAVENOUS | Status: AC
  Filled 2022-07-21: qty 250

## 2022-07-21 MED ORDER — ACETAMINOPHEN 325 MG PO TABS: 650.0000 mg | ORAL_TABLET | ORAL | Status: DC | PRN

## 2022-07-21 MED ORDER — PROPOFOL 10 MG/ML IV BOLUS
INTRAVENOUS | Status: DC | PRN
  Administered 2022-07-21: 200 mg via INTRAVENOUS

## 2022-07-21 MED ORDER — FENTANYL CITRATE (PF) 250 MCG/5ML IJ SOLN
INTRAMUSCULAR | Status: DC | PRN
  Administered 2022-07-21: 100 ug via INTRAVENOUS

## 2022-07-21 MED ORDER — MIDAZOLAM HCL 2 MG/2ML IJ SOLN
INTRAMUSCULAR | Status: DC | PRN
  Administered 2022-07-21: 2 mg via INTRAVENOUS

## 2022-07-21 MED ORDER — HEPARIN (PORCINE) IN NACL 1000-0.9 UT/500ML-% IV SOLN
INTRAVENOUS | Status: DC | PRN
  Administered 2022-07-21 (×4): 500 mL

## 2022-07-21 MED ORDER — PHENYLEPHRINE HCL-NACL 20-0.9 MG/250ML-% IV SOLN
INTRAVENOUS | Status: DC | PRN
  Administered 2022-07-21: 50 ug/min via INTRAVENOUS

## 2022-07-21 MED ORDER — PROTAMINE SULFATE 10 MG/ML IV SOLN
INTRAVENOUS | Status: DC | PRN
  Administered 2022-07-21: 40 mg via INTRAVENOUS

## 2022-07-21 MED ORDER — SODIUM CHLORIDE 0.9 % IV SOLN: 250.0000 mL | INTRAVENOUS | Status: DC | PRN

## 2022-07-21 MED ORDER — SODIUM CHLORIDE 0.9 % IV SOLN: INTRAVENOUS | Status: DC

## 2022-07-21 SURGICAL SUPPLY — 21 items
BLANKET WARM UNDERBOD FULL ACC (MISCELLANEOUS) ×1 IMPLANT
CATH 8FR REPROCESSED SOUNDSTAR (CATHETERS) IMPLANT
CATH 8FR SOUNDSTAR REPROCESSED (CATHETERS) IMPLANT
CATH ABLAT QDOT MICRO BI TC DF (CATHETERS) IMPLANT
CATH OCTARAY 2.0 F 3-3-3-3-3 (CATHETERS) IMPLANT
CATH PIGTAIL STEERABLE D1 8.7 (WIRE) IMPLANT
CATH S-M CIRCA TEMP PROBE (CATHETERS) IMPLANT
CATH SOUNDSTAR ECO 8FR (CATHETERS) IMPLANT
CATH WEBSTER BI DIR CS D-F CRV (CATHETERS) IMPLANT
CLOSURE PERCLOSE PROSTYLE (VASCULAR PRODUCTS) IMPLANT
COVER SWIFTLINK CONNECTOR (BAG) ×1 IMPLANT
PACK EP LATEX FREE (CUSTOM PROCEDURE TRAY) ×1
PACK EP LF (CUSTOM PROCEDURE TRAY) ×1 IMPLANT
PAD DEFIB RADIO PHYSIO CONN (PAD) ×1 IMPLANT
PATCH CARTO3 (PAD) IMPLANT
SHEATH CARTO VIZIGO SM CVD (SHEATH) IMPLANT
SHEATH PINNACLE 7F 10CM (SHEATH) IMPLANT
SHEATH PINNACLE 8F 10CM (SHEATH) IMPLANT
SHEATH PINNACLE 9F 10CM (SHEATH) IMPLANT
SHEATH PROBE COVER 6X72 (BAG) IMPLANT
TUBING SMART ABLATE COOLFLOW (TUBING) IMPLANT

## 2022-07-21 NOTE — Anesthesia Preprocedure Evaluation (Addendum)
Anesthesia Evaluation  Patient identified by MRN, date of birth, ID band Patient awake    Reviewed: Allergy & Precautions, NPO status , Patient's Chart, lab work & pertinent test results  Airway Mallampati: II  TM Distance: >3 FB Neck ROM: Full    Dental no notable dental hx. (+) Teeth Intact, Dental Advisory Given, Caps   Pulmonary pneumonia, resolved, COPD,  COPD inhaler, former smoker,    Pulmonary exam normal breath sounds clear to auscultation       Cardiovascular Exercise Tolerance: Good Normal cardiovascular exam+ dysrhythmias Atrial Fibrillation  Rhythm:Irregular Rate:Normal  EKG 05/31/22 NSR, Normal  Hx/o atrial fibrillation S/P ablation 2019 on Pacerone   Neuro/Psych negative neurological ROS  negative psych ROS   GI/Hepatic negative GI ROS, Neg liver ROS,   Endo/Other    Renal/GU negative Renal ROS  negative genitourinary   Musculoskeletal   Abdominal   Peds  Hematology Xarelto therapy- last dose last pm   Anesthesia Other Findings   Reproductive/Obstetrics                            Anesthesia Physical Anesthesia Plan  ASA: 2  Anesthesia Plan: General   Post-op Pain Management: Minimal or no pain anticipated   Induction: Intravenous  PONV Risk Score and Plan: 3 and Treatment may vary due to age or medical condition and Ondansetron  Airway Management Planned: Oral ETT  Additional Equipment: None  Intra-op Plan:   Post-operative Plan: Extubation in OR  Informed Consent: I have reviewed the patients History and Physical, chart, labs and discussed the procedure including the risks, benefits and alternatives for the proposed anesthesia with the patient or authorized representative who has indicated his/her understanding and acceptance.     Dental advisory given  Plan Discussed with: CRNA and Anesthesiologist  Anesthesia Plan Comments:         Anesthesia  Quick Evaluation

## 2022-07-21 NOTE — H&P (Signed)
Electrophysiology Office Note   Date:  07/21/2022   ID:  Deborah Ortiz, Deborah Ortiz 07/27/1956, MRN [id number]  PCP:  [name], MD  Cardiologist:   Primary Electrophysiologist:  Deborah Hedding Meredith Leeds, MD    No chief complaint on file.     History of Present Illness: Deborah Ortiz is a 66 y.o. female who is being seen today for the evaluation of atrial fibrillation at the request of No ref. provider found. Presenting today for electrophysiology evaluation.    She has a history significant for paroxysmal atrial fibrillation, mitral vegetation, PVCs.  November 2018 she started feel poorly after nail infection.  She was noted to be in atrial fibrillation was started on Xarelto and sotalol.  She had a cardioversion.  She is status post cryoablation April 2019.  Sotalol was stopped due to dizziness.  She was found to have an elevated burden of PVCs at 13%.  Today, denies symptoms of palpitations, chest pain, shortness of breath, orthopnea, PND, lower extremity edema, claudication, dizziness, presyncope, syncope, bleeding, or neurologic sequela. The patient is tolerating medications without difficulties. Plan ablation today.   Past Medical History:  Diagnosis Date   Long term (current) use of anticoagulants    Mitral valve regurgitation    Palpitations    Paroxysmal atrial fibrillation (HCC)    Pneumonia    HISTORY   Sinusitis, acute    Past Surgical History:  Procedure Laterality Date   CRYOABLATION     heart   HERNIA REPAIR     TONSILLECTOMY     TUBAL LIGATION       Current Facility-Administered Medications  Medication Dose Route Frequency Provider Last Rate Last Admin   0.9 %  sodium chloride infusion   Intravenous Continuous Deborah Artley Hassell Done, MD        Allergies:   Doxycycline, Iohexol, Metoprolol, and Nitrofurantoin   Social History:  The patient  reports that she quit smoking about 14 years ago. Her smoking use included cigarettes. She has a 30.00  pack-year smoking history. She has never used smokeless tobacco. She reports current alcohol use of about 2.0 standard drinks of alcohol per week. She reports that she does not use drugs.   Family History:  The patient's family history includes CVA in her mother; Cancer in her father and mother; Diabetes in her mother; Heart disease in her mother; Stroke in her mother.   ROS:  Please see the history of present illness.   Otherwise, review of systems is positive for none.   All other systems are reviewed and negative.   PHYSICAL EXAM: VS:  BP (!) 148/77   Pulse 68   Temp (!) 97.3 F (36.3 C) (Temporal)   Resp 18   Ht '5\' 4"'$  (1.626 m)   Wt 78 kg   SpO2 100%   BMI 29.52 kg/m  , BMI Body mass index is 29.52 kg/m. GEN: Well nourished, well developed, in no acute distress  HEENT: normal  Neck: no JVD, carotid bruits, or masses Cardiac: RRR; no murmurs, rubs, or gallops,no edema  Respiratory:  clear to auscultation bilaterally, normal work of breathing GI: soft, nontender, nondistended, + BS MS: no deformity or atrophy  Skin: warm and dry Neuro:  Strength and sensation are intact Psych: euthymic mood, full affect   Recent Labs: 05/31/2022: ALT 22; B Natriuretic Peptide 113.6; TSH 6.161 07/04/2022: BUN 22; Creatinine, Ser 0.81; Hemoglobin 13.6; Platelets 195; Potassium 4.2; Sodium 144    Lipid Panel  No results found  for: "CHOL", "TRIG", "HDL", "CHOLHDL", "VLDL", "LDLCALC", "LDLDIRECT"   Wt Readings from Last 3 Encounters:  07/21/22 78 kg  05/31/22 78.9 kg  04/18/22 77.6 kg      Other studies Reviewed: Additional studies/ records that were reviewed today include: TTE 09/26/2020  Review of the above records today demonstrates:   1. Left ventricular ejection fraction, by estimation, is 65 to 70%. The  left ventricle has normal function. The left ventricle has no regional  wall motion abnormalities. Left ventricular diastolic parameters were  normal.   2. Right ventricular  systolic function is normal. The right ventricular  size is normal. There is normal pulmonary artery systolic pressure.   3. The mitral valve is normal in structure. Mild mitral valve  regurgitation. No evidence of mitral stenosis.   4. The aortic valve is normal in structure. Aortic valve regurgitation is  not visualized. No aortic stenosis is present.   5. The inferior vena cava is normal in size with greater than 50%  respiratory variability, suggesting right atrial pressure of 3 mmHg.   Cardiac CT: LAD eccentric calcified plaque less than 50% Circumflex no significant stenosis RCA motion artifact throughout precludes accurate assessment Left main no significant narrowing  Cardiac monitor 05/04/2020 personally reviewed Max 187 bpm 07:27pm, 07/18 Min 52 bpm 05:22am, 07/18 Avg 73 bpm 1% PACs 13.4% PVCs 26 VT runs, longest 5 beats 9 SVT runs noted, longest 27 seconds at 116 bpm Triggered events associated with sinus rhythm, PVCs, SVT   ASSESSMENT AND PLAN:  1.  Paroxysmal atrial fibrillation/atypical atrial flutter: Deborah Ortiz has presented today for surgery, with the diagnosis of AF.  The various methods of treatment have been discussed with the patient and family. After consideration of risks, benefits and other options for treatment, the patient has consented to  Procedure(s): Catheter ablation as a surgical intervention .  Risks include but not limited to complete heart block, stroke, esophageal damage, nerve damage, bleeding, vascular damage, tamponade, perforation, MI, and death. The patient's history has been reviewed, patient examined, no change in status, stable for surgery.  I have reviewed the patient's chart and labs.  Questions were answered to the patient's satisfaction.    Deborah Klink Curt Bears, MD 07/21/2022 11:56 AM

## 2022-07-21 NOTE — Anesthesia Procedure Notes (Signed)
Procedure Name: Intubation Date/Time: 07/21/2022 1:05 PM  Performed by: Lance Coon, CRNAPre-anesthesia Checklist: Patient identified, Emergency Drugs available, Suction available, Patient being monitored and Timeout performed Patient Re-evaluated:Patient Re-evaluated prior to induction Oxygen Delivery Method: Circle system utilized Preoxygenation: Pre-oxygenation with 100% oxygen Induction Type: IV induction Ventilation: Mask ventilation without difficulty Laryngoscope Size: Miller and 3 Grade View: Grade II Tube type: Oral Tube size: 7.0 mm Number of attempts: 1 Airway Equipment and Method: Stylet Placement Confirmation: ETT inserted through vocal cords under direct vision, positive ETCO2 and breath sounds checked- equal and bilateral Secured at: 21 cm Tube secured with: Tape Dental Injury: Teeth and Oropharynx as per pre-operative assessment

## 2022-07-21 NOTE — Discharge Instructions (Signed)
Post procedure care instructions No driving for 4 days. No lifting over 5 lbs for 1 week. No vigorous or sexual activity for 1 week. You may return to work/your usual activities on 07/29/22. Keep procedure site clean & dry. If you notice increased pain, swelling, bleeding or pus, call/return!  You may shower after 24 hours, but no soaking in baths/hot tubs/pools for 1 week.   You have an appointment set up with the Atrial Fibrillation Clinic.  Multiple studies have shown that being followed by a dedicated atrial fibrillation clinic in addition to the standard care you receive from your other physicians improves health. We believe that enrollment in the atrial fibrillation clinic will allow us to better care for you.   The phone number to the Atrial Fibrillation Clinic is [phone]. The clinic is staffed Monday through Friday from 8:30am to 5pm.  Parking Directions: The clinic is located in the Heart and Vascular Building connected to [facility]. 1)From [address] turn on to [address] and go to the 3rd entrance  (Heart and Vascular entrance) on the right. 2)Look to the right for Heart &Vascular Parking Garage. 3)A code for the entrance is required please call the clinic to receive this.   4)Take the elevators to the 1st floor. Registration is in the room with the glass walls at the end of the hallway.  If you have any trouble parking or locating the clinic, please don't hesitate to call [phone].  

## 2022-07-21 NOTE — Transfer of Care (Signed)
Immediate Anesthesia Transfer of Care Note  Patient: Deborah Ortiz  Procedure(s) Performed: ATRIAL FIBRILLATION ABLATION  Patient Location: Cath Lab  Anesthesia Type:General  Level of Consciousness: awake, alert , and oriented  Airway & Oxygen Therapy: Patient Spontanous Breathing and Patient connected to nasal cannula oxygen  Post-op Assessment: Report given to RN and Post -op Vital signs reviewed and stable  Post vital signs: Reviewed and stable  Last Vitals:  Vitals Value Taken Time  BP 145/61 07/21/22 1447  Temp 36.7 C 07/21/22 1447  Pulse 77 07/21/22 1449  Resp 13 07/21/22 1449  SpO2 99 % 07/21/22 1449  Vitals shown include unvalidated device data.  Last Pain:  Vitals:   07/21/22 1447  TempSrc: Temporal  PainSc: 0-No pain         Complications: There were no known notable events for this encounter.

## 2022-07-24 ENCOUNTER — Encounter (HOSPITAL_COMMUNITY): Payer: Self-pay | Admitting: Cardiology

## 2022-07-24 NOTE — Anesthesia Postprocedure Evaluation (Signed)
Anesthesia Post Note  Patient: Deborah Ortiz  Procedure(s) Performed: ATRIAL FIBRILLATION ABLATION     Patient location during evaluation: PACU Anesthesia Type: General Level of consciousness: awake and alert Pain management: pain level controlled Vital Signs Assessment: post-procedure vital signs reviewed and stable Respiratory status: spontaneous breathing, nonlabored ventilation, respiratory function stable and patient connected to nasal cannula oxygen Cardiovascular status: blood pressure returned to baseline and stable Postop Assessment: no apparent nausea or vomiting Anesthetic complications: no   There were no known notable events for this encounter.  Last Vitals:  Vitals:   07/21/22 1700 07/21/22 1800  BP: (!) 143/75 134/70  Pulse: 69 73  Resp: 14 17  Temp:    SpO2: 95% 96%    Last Pain:  Vitals:   07/21/22 1530  TempSrc:   PainSc: 0-No pain                 [name] P Shavana Calder

## 2022-08-02 ENCOUNTER — Ambulatory Visit (INDEPENDENT_AMBULATORY_CARE_PROVIDER_SITE_OTHER): Payer: Medicare Other

## 2022-08-02 DIAGNOSIS — M722 Plantar fascial fibromatosis: Secondary | ICD-10-CM

## 2022-08-02 NOTE — Progress Notes (Signed)
Patient in office today to pick-up her custom orthotics. Upon examining the orthotics we noticed that the 3/8 lift that was ordered for the right orthotic was placed in the left orthotic.  Patient states she already has some protech orthotics that are very similar to these and she through she would be getting something different today.   Patient requested to hold off on sending the orthotics back to the lab for adjustments at this time. Patient wants to get a pick of her friend's orthotic who is a patient here to see if she can have the same kind of orthotic her friend has before moving forward. Patient will call the office to discuss how she would like to proceed soon.

## 2022-08-09 ENCOUNTER — Telehealth: Payer: Self-pay | Admitting: Podiatry

## 2022-08-09 NOTE — Telephone Encounter (Signed)
Patient called and stated that she was seen on 08/02/2022 to pick up her orthotics and she stated that the lab had mad a mistake and wanted the orthotics to be sent back. She did state that she wanted to hold off on the orthotics and was told that she could go back to the front desk and get a refund the same day for $490.00. She was told by the person that check her out that it was taken care of. But she states that she got her credit card bill and there is still a charge for the orthotics. She stated that she would like a refund and she would like it sent to her quicker than 3 weeks. I did let the patient know that our practice admin was out of the office this week. Patient stated that she will call back on Monday 11/13 to see if there could be a rush put on her check through [facility].  I spoke with Mrs. Jocelyn Lamer and she stated she will go ahead and start. the process of the refund.  Please advise

## 2022-08-18 ENCOUNTER — Ambulatory Visit (HOSPITAL_COMMUNITY)
Admission: RE | Admit: 2022-08-18 | Discharge: 2022-08-18 | Disposition: A | Discharge status: Home / Self Care | Payer: Medicare Other | Source: Ambulatory Visit | Attending: Physician Assistant | Admitting: Physician Assistant

## 2022-08-18 VITALS — BP 136/88 | HR 71 | Ht 64.0 in | Wt 172.4 lb

## 2022-08-18 DIAGNOSIS — I480 Paroxysmal atrial fibrillation: Secondary | ICD-10-CM | POA: Diagnosis present

## 2022-08-18 DIAGNOSIS — Z7902 Long term (current) use of antithrombotics/antiplatelets: Secondary | ICD-10-CM | POA: Insufficient documentation

## 2022-08-18 DIAGNOSIS — I4892 Unspecified atrial flutter: Secondary | ICD-10-CM | POA: Insufficient documentation

## 2022-08-18 DIAGNOSIS — I445 Left posterior fascicular block: Secondary | ICD-10-CM | POA: Diagnosis not present

## 2022-08-18 DIAGNOSIS — R9431 Abnormal electrocardiogram [ECG] [EKG]: Secondary | ICD-10-CM | POA: Insufficient documentation

## 2022-08-18 DIAGNOSIS — Z7901 Long term (current) use of anticoagulants: Secondary | ICD-10-CM | POA: Diagnosis not present

## 2022-08-18 DIAGNOSIS — Z79899 Other long term (current) drug therapy: Secondary | ICD-10-CM | POA: Insufficient documentation

## 2022-08-18 NOTE — Progress Notes (Signed)
Primary Care Physician: [name], MD Primary Electrophysiologist: Dr Curt Bears Referring Physician: Dr Marlyn Corporal is a 66 y.o. female with a history of MR, PVCs, atrial fibrillation who presents for follow up in the [facility].  The patient was initially diagnosed with atrial fibrillation 2018 and had a cryoablation in 2019. Patient is on Xarelto for a CHADS2VASC score of 2. She was seen by Dr Curt Bears 03/21/22 and was found to be in atrial flutter. She was started on diltiazem and scheduled for ablation. In the interim, she was started on amiodarone as a bridge to ablation.   On follow up today, patient is s/p afib and flutter ablation with Dr Curt Bears on 07/21/22. She reports that she has done well since the procedure and has more energy. She has been able to increase her walking distance. She did have sore groin sites for about 10 days but this has resolved. She denies chest pain or swallowing pain.   Today, she denies symptoms of palpitations, chest pain, shortness of breath, orthopnea, PND, dizziness, presyncope, syncope, snoring, daytime somnolence, bleeding, or neurologic sequela.    Atrial Fibrillation Risk Factors:  she does not have symptoms or diagnosis of sleep apnea. she does not have a history of rheumatic fever. she does not have a history of alcohol use. The patient does not have a history of early familial atrial fibrillation or other arrhythmias.  she has a BMI of Body mass index is 29.59 kg/m.Marland Kitchen Filed Weights   08/18/22 0946  Weight: 78.2 kg    Family History  Problem Relation Age of Onset   CVA Mother    Stroke Mother    Heart disease Mother    Diabetes Mother    Cancer Mother    Cancer Father      Atrial Fibrillation Management history:  Previous antiarrhythmic drugs: sotalol, amiodarone  Previous cardioversions: 2019 Previous ablations: 2019 cyroablation, 07/21/22 CHADS2VASC score: 2 Anticoagulation  history: Xarelto    Past Medical History:  Diagnosis Date   Long term (current) use of anticoagulants    Mitral valve regurgitation    Palpitations    Paroxysmal atrial fibrillation (HCC)    Pneumonia    HISTORY   Sinusitis, acute    Past Surgical History:  Procedure Laterality Date   ATRIAL FIBRILLATION ABLATION N/A 07/21/2022   Procedure: ATRIAL FIBRILLATION ABLATION;  Surgeon: Constance Haw, MD;  Location: [facility];  Service: Cardiovascular;  Laterality: N/A;   CRYOABLATION     heart   HERNIA REPAIR     TONSILLECTOMY     TUBAL LIGATION      Current Outpatient Medications  Medication Sig Dispense Refill   amiodarone (PACERONE) 200 MG tablet Take 0.5 tablets (100 mg total) by mouth daily. 60 tablet 6   Ascorbic Acid (VITAMIN C PO) Take 1 tablet by mouth daily.     B Complex Vitamins (VITAMIN B COMPLEX PO) Take by mouth.     BIOTIN PO Take 1 tablet by mouth daily.     Calcium-Vitamin D-Vitamin K (CALCIUM + D) [phone] MG-UNT-MCG CHEW See admin instructions.     Carboxymethylcellul-Glycerin (REFRESH OPTIVE OP) Place 1-2 vials into both eyes daily.     Cholecalciferol (VITAMIN D-3 PO) Take 1 capsule by mouth daily.     COLLAGEN PO Take 1 tablet by mouth daily.     diltiazem (CARDIZEM CD) 120 MG 24 hr capsule TAKE 1 CAPSULE BY MOUTH EVERY DAY 30 capsule  3   Glucosamine HCl (GLUCOSAMINE PO) Take 1,000 mg by mouth daily. Shell fish free     levalbuterol (XOPENEX HFA) 45 MCG/ACT inhaler Inhale 1 puff into the lungs every 6 (six) hours as needed for wheezing or shortness of breath.     Multiple Vitamin (MULTIVITAMIN) capsule Take 1 capsule by mouth daily.     Omega-3 Fatty Acids (FISH OIL PO) Take 1 capsule by mouth daily.      Probiotic Product (PROBIOTIC PO) Take 1 tablet by mouth daily.     rivaroxaban (XARELTO) 20 MG TABS tablet Take 1 tablet (20 mg total) by mouth daily with supper. 30 tablet 5   triamcinolone cream (KENALOG) 0.5 % Apply 1 Application  topically as needed.     No current facility-administered medications for this encounter.    Allergies  Allergen Reactions   Doxycycline Hives, Other (See Comments) and Rash    Other reaction(s): Confusion (intolerance)   Iohexol Cough    Lips tingling, difficulty swallowing, dry cough . Patient will need pre-meds before administration of contrast   Heart race that night.   Metoprolol Hives    Extreme fatigue   Nitrofurantoin Itching, Other (See Comments) and Swelling    Social History   Socioeconomic History   Marital status: Divorced    Spouse name: Not on file   Number of children: 2   Years of education: Not on file   Highest education level: Not on file  Occupational History   Occupation: RETIRED  Tobacco Use   Smoking status: Former    Packs/day: 1.00    Years: 30.00    Total pack years: 30.00    Types: Cigarettes    Quit date: 11/16/2007    Years since quitting: 14.7   Smokeless tobacco: Never   Tobacco comments:    Former smoker 04/11/22  Vaping Use   Vaping Use: Never used  Substance and Sexual Activity   Alcohol use: Yes    Alcohol/week: 2.0 standard drinks of alcohol    Types: 2 Glasses of wine per week    Comment: 1-2 glasses of wine weekly 04/11/22   Drug use: Never   Sexual activity: Not on file  Other Topics Concern   Not on file  Social History Narrative   Not on file   Social Determinants of Health   Financial Resource Strain: Not on file  Food Insecurity: Not on file  Transportation Needs: Not on file  Physical Activity: Not on file  Stress: Not on file  Social Connections: Not on file  Intimate Partner Violence: Not on file     ROS- All systems are reviewed and negative except as per the HPI above.  Physical Exam: Vitals:   08/18/22 0946  BP: 136/88  Pulse: 71  Weight: 78.2 kg  Height: '5\' 4"'$  (1.626 m)     GEN- The patient is a well appearing female, alert and oriented x 3 today.   HEENT-head normocephalic, atraumatic,  sclera clear, conjunctiva pink, hearing intact, trachea midline. Lungs- Clear to ausculation bilaterally, normal work of breathing Heart- Regular rate and rhythm, no murmurs, rubs or gallops  GI- soft, NT, ND, + BS Extremities- no clubbing, cyanosis, or edema MS- no significant deformity or atrophy Skin- no rash or lesion Psych- euthymic mood, full affect Neuro- strength and sensation are intact   Wt Readings from Last 3 Encounters:  08/18/22 78.2 kg  07/21/22 78 kg  05/31/22 78.9 kg    EKG today demonstrates  SR, LPFB  Vent. rate 71 BPM PR interval 192 ms QRS duration 72 ms QT/QTcB 424/460 ms  Echo 09/26/20 demonstrated   1. Left ventricular ejection fraction, by estimation, is 65 to 70%. The  left ventricle has normal function. The left ventricle has no regional  wall motion abnormalities. Left ventricular diastolic parameters were  normal.   2. Right ventricular systolic function is normal. The right ventricular  size is normal. There is normal pulmonary artery systolic pressure.   3. The mitral valve is normal in structure. Mild mitral valve  regurgitation. No evidence of mitral stenosis.   4. The aortic valve is normal in structure. Aortic valve regurgitation is  not visualized. No aortic stenosis is present.   5. The inferior vena cava is normal in size with greater than 50%  respiratory variability, suggesting right atrial pressure of 3 mmHg.   Epic records are reviewed at length today  CHA2DS2-VASc Score = 2  The patient's score is based upon: CHF History: 0 HTN History: 0 Diabetes History: 0 Stroke History: 0 Vascular Disease History: 0 Age Score: 1 Gender Score: 1       ASSESSMENT AND PLAN: 1. Paroxysmal Atrial Fibrillation/atrial flutter The patient's CHA2DS2-VASc score is 2, indicating a 2.2% annual risk of stroke.   S/p afib and flutter ablation 07/21/22 Patient appears to be maintaining SR. Continue amiodarone 100 mg daily for now, anticipate this  will be short term. Continue Xarelto 20 mg daily with no missed doses for 3 months post ablation.  Continue diltiazem 120 mg daily   Follow up with Dr Curt Bears as scheduled.    [facility] [address] [place], [place] [address] [phone] 08/18/2022 10:14 AM

## 2022-08-30 ENCOUNTER — Other Ambulatory Visit (HOSPITAL_COMMUNITY): Payer: Self-pay | Admitting: *Deleted

## 2022-08-30 MED ORDER — DILTIAZEM HCL ER COATED BEADS 120 MG PO CP24: 120.0000 mg | ORAL_CAPSULE | Freq: Every day | ORAL | 3 refills | Status: DC

## 2022-09-06 IMAGING — CT CT ABDOMEN W/O CM
1 of 2 series · 13 of 32 positions shown, 19 images · non-contrast
Comparison: CT chest 02/15/2021

CLINICAL DATA: Abdominal pain, back pain, history of
nephrolithiasis

EXAM:
CT ABDOMEN WITHOUT CONTRAST
TECHNIQUE: Multidetector CT imaging of the abdomen was performed following the
standard protocol without IV contrast.

[Series 2: abd w/(date) · axial · 0.75mm/px · z∈[-333,-148]mm · 13 of 43 slices shown, 19 images]
[im 3/43  soft-tissue]
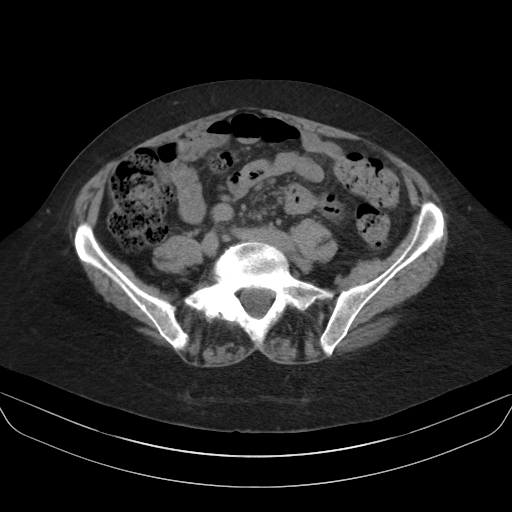
[im 3/43  bone]
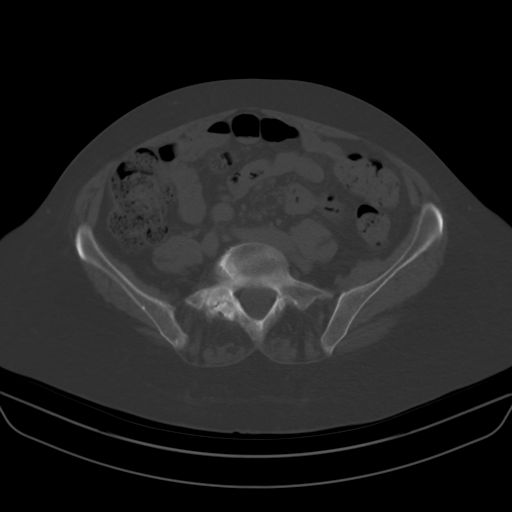
[im 6/43  soft-tissue]
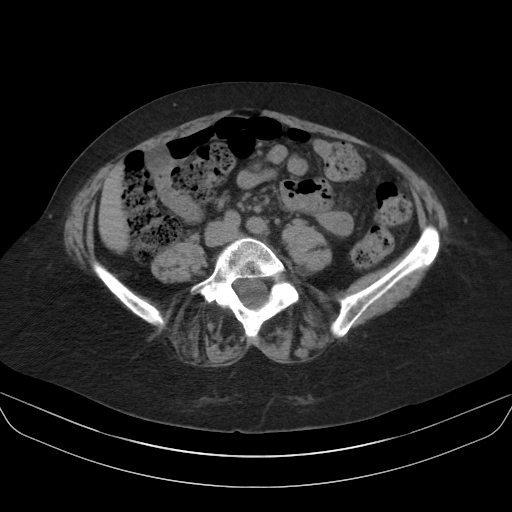
[im 9/43  soft-tissue]
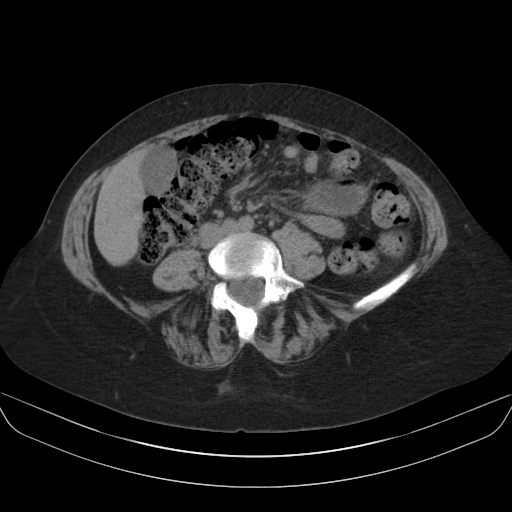
[im 12/43  soft-tissue]
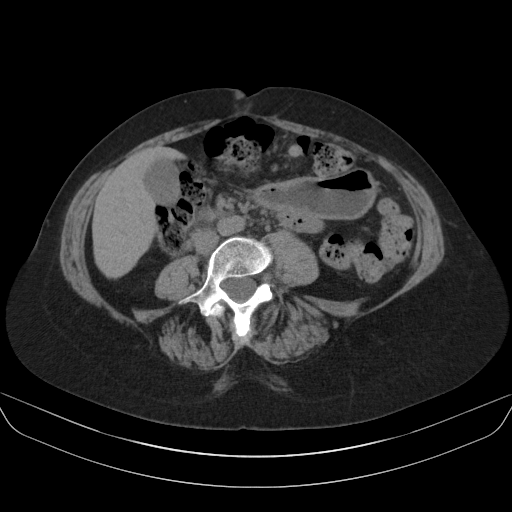
[im 15/43  soft-tissue]
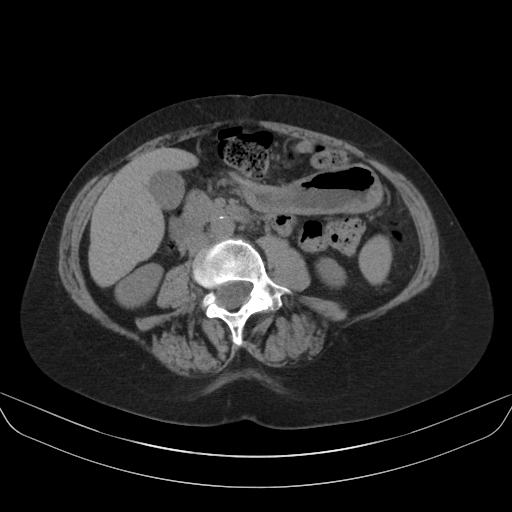
[im 17/43  soft-tissue]
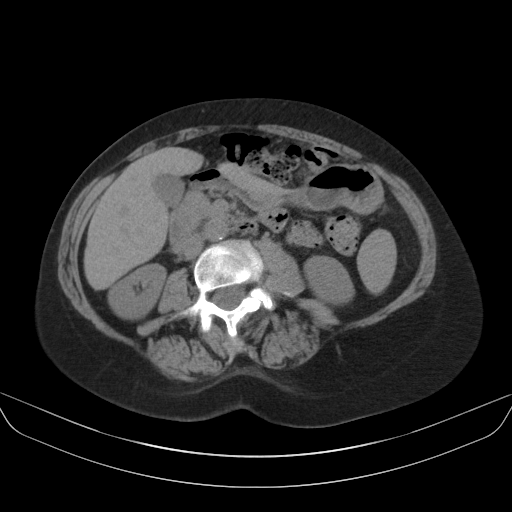
[im 23/43  soft-tissue]
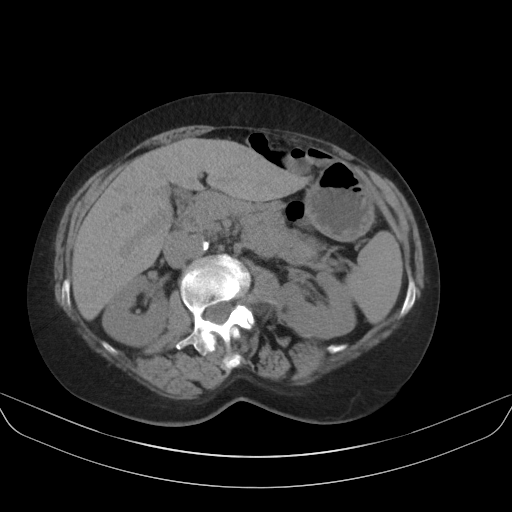
[im 26/43  soft-tissue]
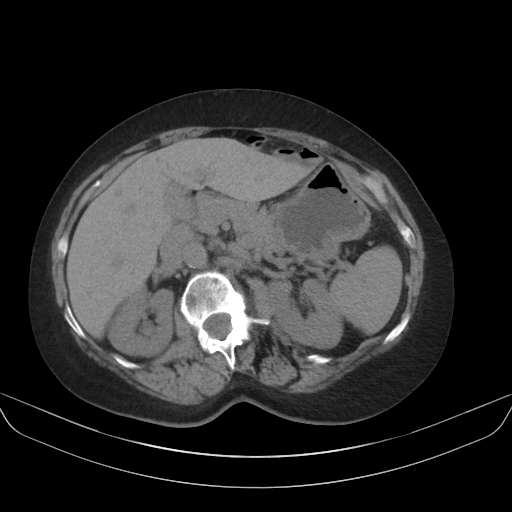
[im 29/43  soft-tissue]
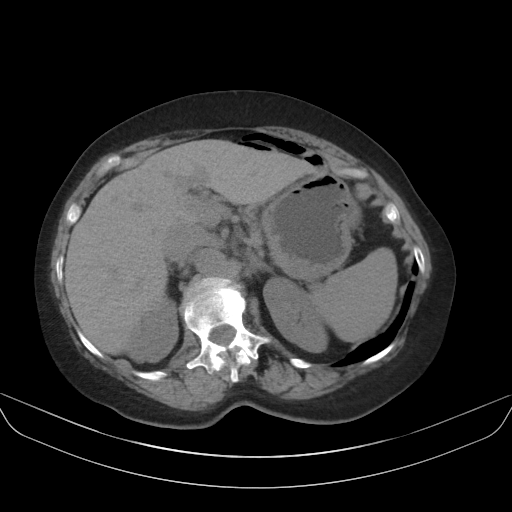
[im 29/43  bone]
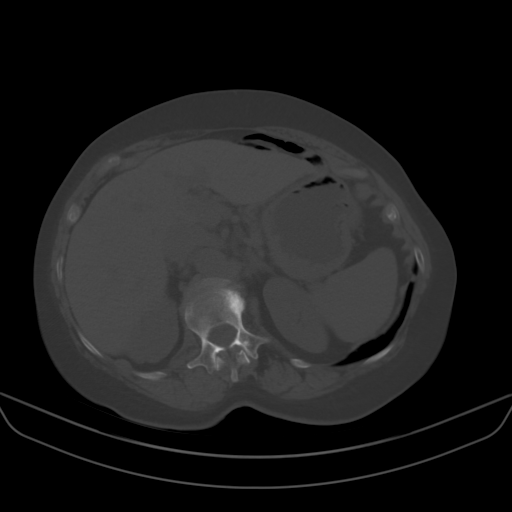
[im 31/43  soft-tissue]
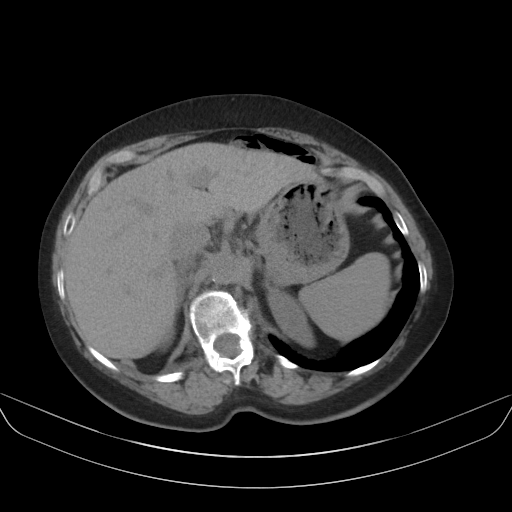
[im 31/43  lung]
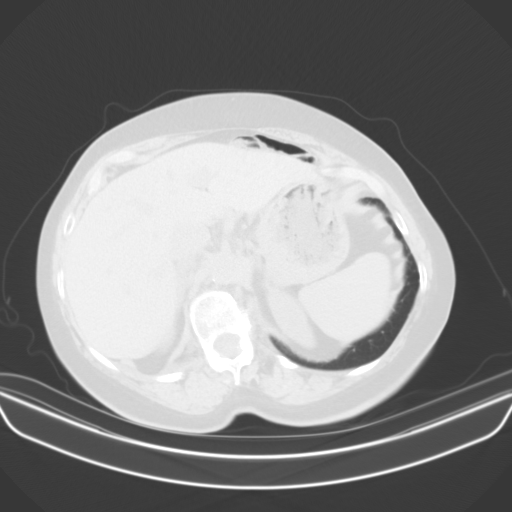
[im 34/43  soft-tissue]
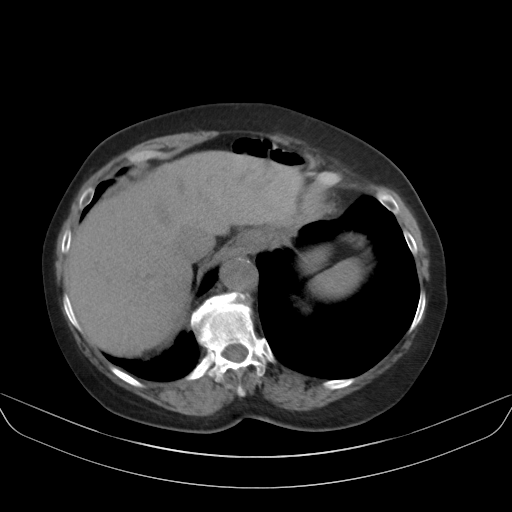
[im 34/43  lung]
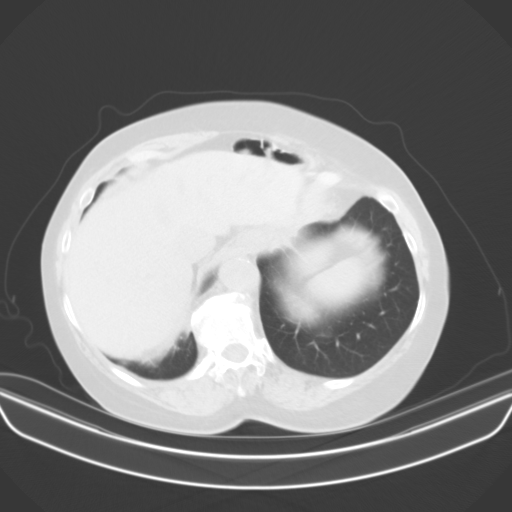
[im 37/43  soft-tissue]
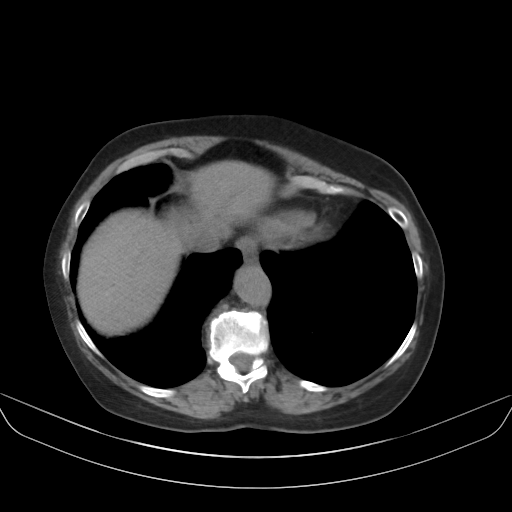
[im 37/43  lung]
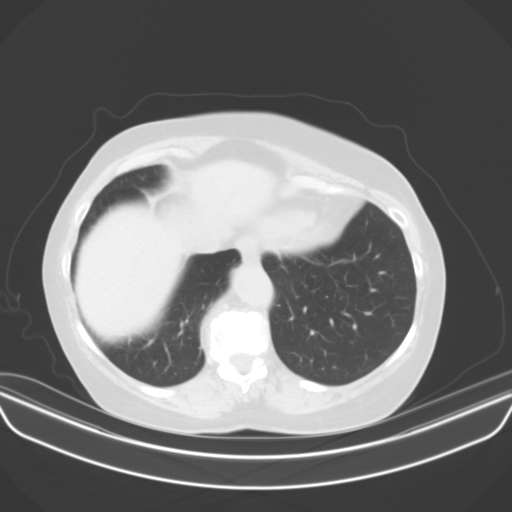
[im 40/43  soft-tissue]
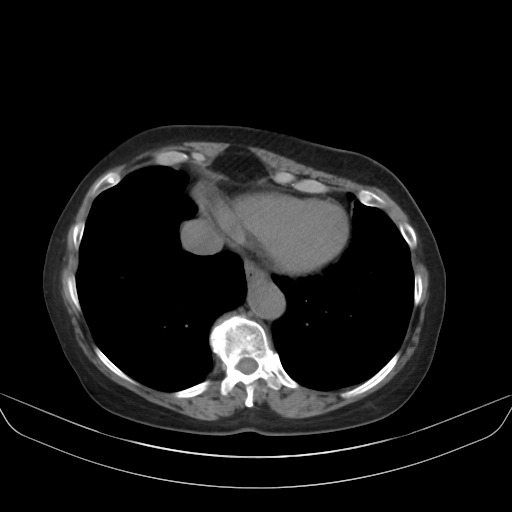
[im 40/43  lung]
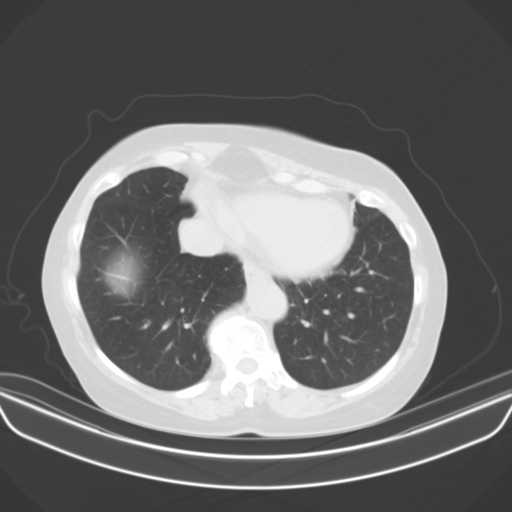

[13 of 32 positions shown; findings below may reference images not displayed]

FINDINGS: Lower chest: No significant pleural or pericardial effusion. Lung
bases clear.

Hepatobiliary: No focal liver abnormality is seen. No gallstones,
gallbladder wall thickening, or biliary dilatation.

Pancreas: Unremarkable. No pancreatic ductal dilatation or
surrounding inflammatory changes.

Spleen: Normal in size without focal abnormality.

Adrenals/Urinary Tract: Adrenal glands are unremarkable. Kidneys are
normal, without renal calculi, focal lesion, or hydronephrosis.

Stomach/Bowel: Stomach is partially distended by ingested material,
unremarkable. Visualized portions of small bowel and colon
unremarkable.

Vascular/Lymphatic: Mild scattered calcified aortic plaque without
aneurysm. No mesenteric or retroperitoneal adenopathy localized.

Other: No abdominal ascites.  No free air.

Musculoskeletal: There is a thoracolumbar dextroscoliosis with mild
multilevel spondylitic change. No evident underlying vertebral
anomaly evident. No fracture or worrisome bone lesion.
IMPRESSION: 1. No acute findings.
2.  Aortic Atherosclerosis (HLV2B-170.0).

## 2022-09-11 ENCOUNTER — Telehealth: Payer: Self-pay | Admitting: Podiatry

## 2022-09-11 NOTE — Telephone Encounter (Signed)
Please call patient , she was told to speak with Christan to find out if we can bill Medicare first for the orthotics? Her secondary insurance will not pick up the cost without [place] billing Medicare first, is there a way for this to be done beofre the end of year? Also her orthotics that were made for her were made backwards, the arch was put in the wrong orthotic, so she had to have those credited off of her account.  I asked her to come in to be re-casted , she would like to speak with Christan first.

## 2022-09-28 NOTE — Telephone Encounter (Signed)
Left messsage on voicemail that orthotics are in.

## 2022-09-29 ENCOUNTER — Ambulatory Visit (INDEPENDENT_AMBULATORY_CARE_PROVIDER_SITE_OTHER): Payer: Medicare Other | Admitting: *Deleted

## 2022-09-29 DIAGNOSIS — M7671 Peroneal tendinitis, right leg: Secondary | ICD-10-CM | POA: Diagnosis not present

## 2022-09-29 DIAGNOSIS — M722 Plantar fascial fibromatosis: Secondary | ICD-10-CM | POA: Diagnosis not present

## 2022-09-29 NOTE — Progress Notes (Signed)
Patient presents today to pick up custom molded foot orthotics, diagnosed with plantar fasciitis by Dr. Paulla Dolly.   Orthotics were dispensed and fit was satisfactory. Reviewed instructions for break-in and wear. Written instructions given to patient.  Patient will follow up as needed.   [facility] - order [phone]

## 2022-10-19 ENCOUNTER — Ambulatory Visit: Payer: Medicare Other | Attending: Cardiology | Admitting: Cardiology

## 2022-10-19 ENCOUNTER — Encounter: Payer: Self-pay | Admitting: Cardiology

## 2022-10-19 VITALS — BP 124/84 | HR 79 | Ht 64.0 in | Wt 173.0 lb

## 2022-10-19 DIAGNOSIS — I480 Paroxysmal atrial fibrillation: Secondary | ICD-10-CM | POA: Diagnosis not present

## 2022-10-19 DIAGNOSIS — D6869 Other thrombophilia: Secondary | ICD-10-CM | POA: Insufficient documentation

## 2022-10-19 DIAGNOSIS — I493 Ventricular premature depolarization: Secondary | ICD-10-CM | POA: Diagnosis not present

## 2022-10-19 MED ORDER — RIVAROXABAN 20 MG PO TABS: 20.0000 mg | ORAL_TABLET | Freq: Every day | ORAL | 3 refills | Status: DC

## 2022-10-19 NOTE — Patient Instructions (Addendum)
Medication Instructions:  Your physician has recommended you make the following change in your medication:  STOP Amiodarone STOP Diltiazem  *If you need a refill on your cardiac medications before your next appointment, please call your pharmacy*   Lab Work: None ordered    Testing/Procedures: None ordered   Follow-Up: At [facility], you and your health needs are our priority.  As part of our continuing mission to provide you with exceptional heart care, we have created designated Provider Care Teams.  These Care Teams include your primary Cardiologist (physician) and Advanced Practice Providers (APPs -  Physician Assistants and Nurse Practitioners) who all work together to provide you with the care you need, when you need it.   Your next appointment:   6 month(s)  The format for your next appointment:   In Person  Provider:   Allegra Lai, MD{  Thank you for choosing [facility]!!   Trinidad Curet, RN [phone]

## 2022-10-19 NOTE — Progress Notes (Signed)
Electrophysiology Office Note   Date:  10/19/2022   ID:  Danie, Hannig 08-Mar-1956, MRN [id number]  PCP:  [name], MD  Cardiologist:   Primary Electrophysiologist:  Citlalic Norlander Meredith Leeds, MD    No chief complaint on file.     History of Present Illness: Deborah Ortiz is a 67 y.o. female who is being seen today for the evaluation of atrial fibrillation at the request of [name], MD. Presenting today for electrophysiology evaluation.    She has a history seen for paroxysmal atrial fibrillation, mitral regurgitation, PVCs.  November 2018 she started to feel poorly after nail infection.  She was noted to be in atrial fibrillation was started on Xarelto and sotalol.  She had a cryoablation in April 2019.  She wore a cardiac monitor that showed a 13% PVC burden.  She continued to have episodes of atrial fibrillation/flutter.  She is post repeat ablation 07/21/2022.  Today, denies symptoms of palpitations, chest pain, shortness of breath, orthopnea, PND, lower extremity edema, claudication, dizziness, presyncope, syncope, bleeding, or neurologic sequela. The patient is tolerating medications without difficulties.  Since her ablation she has done well.  She had a little bit of atrial fibrillation right after Christmas, but otherwise has had no complaints.  She does have intermittent palpitations that could be due to her PVCs.  She is quite happy with her control.   Past Medical History:  Diagnosis Date   Long term (current) use of anticoagulants    Mitral valve regurgitation    Palpitations    Paroxysmal atrial fibrillation (HCC)    Pneumonia    HISTORY   Sinusitis, acute    Past Surgical History:  Procedure Laterality Date   ATRIAL FIBRILLATION ABLATION N/A 07/21/2022   Procedure: ATRIAL FIBRILLATION ABLATION;  Surgeon: Constance Haw, MD;  Location: [facility];  Service: Cardiovascular;  Laterality: N/A;   CRYOABLATION     heart   HERNIA  REPAIR     TONSILLECTOMY     TUBAL LIGATION       Current Outpatient Medications  Medication Sig Dispense Refill   Ascorbic Acid (VITAMIN C PO) Take 1 tablet by mouth daily.     B Complex Vitamins (VITAMIN B COMPLEX PO) Take by mouth.     BIOTIN PO Take 1 tablet by mouth daily.     Calcium-Vitamin D-Vitamin K (CALCIUM + D) [phone] MG-UNT-MCG CHEW See admin instructions.     Carboxymethylcellul-Glycerin (REFRESH OPTIVE OP) Place 1-2 vials into both eyes daily.     Cholecalciferol (VITAMIN D-3 PO) Take 1 capsule by mouth daily.     COLLAGEN PO Take 1 tablet by mouth daily.     Glucosamine HCl (GLUCOSAMINE PO) Take 1,000 mg by mouth daily. Shell fish free     levalbuterol (XOPENEX HFA) 45 MCG/ACT inhaler Inhale 1 puff into the lungs every 6 (six) hours as needed for wheezing or shortness of breath.     Multiple Vitamin (MULTIVITAMIN) capsule Take 1 capsule by mouth daily.     Omega-3 Fatty Acids (FISH OIL PO) Take 1 capsule by mouth daily.      Probiotic Product (PROBIOTIC PO) Take 1 tablet by mouth daily.     triamcinolone cream (KENALOG) 0.5 % Apply 1 Application topically as needed.     rivaroxaban (XARELTO) 20 MG TABS tablet Take 1 tablet (20 mg total) by mouth daily with supper. 30 tablet 3   No current facility-administered medications for this visit.  Allergies:   Doxycycline, Iohexol, Metoprolol, and Nitrofurantoin   Social History:  The patient  reports that she quit smoking about 14 years ago. Her smoking use included cigarettes. She has a 30.00 pack-year smoking history. She has never used smokeless tobacco. She reports current alcohol use of about 2.0 standard drinks of alcohol per week. She reports that she does not use drugs.   Family History:  The patient's family history includes CVA in her mother; Cancer in her father and mother; Diabetes in her mother; Heart disease in her mother; Stroke in her mother.   ROS:  Please see the history of present illness.    Otherwise, review of systems is positive for none.   All other systems are reviewed and negative.   PHYSICAL EXAM: VS:  BP 124/84   Pulse 79   Ht '5\' 4"'$  (1.626 m)   Wt 173 lb (78.5 kg)   SpO2 98%   BMI 29.70 kg/m  , BMI Body mass index is 29.7 kg/m. GEN: Well nourished, well developed, in no acute distress  HEENT: normal  Neck: no JVD, carotid bruits, or masses Cardiac: RRR; no murmurs, rubs, or gallops,no edema  Respiratory:  clear to auscultation bilaterally, normal work of breathing GI: soft, nontender, nondistended, + BS MS: no deformity or atrophy  Skin: warm and dry Neuro:  Strength and sensation are intact Psych: euthymic mood, full affect  EKG:  EKG is ordered today. Personal review of the ekg ordered shows sinus rhythm   Recent Labs: 05/31/2022: ALT 22; B Natriuretic Peptide 113.6; TSH 6.161 07/04/2022: BUN 22; Creatinine, Ser 0.81; Hemoglobin 13.6; Platelets 195; Potassium 4.2; Sodium 144    Lipid Panel  No results found for: "CHOL", "TRIG", "HDL", "CHOLHDL", "VLDL", "LDLCALC", "LDLDIRECT"   Wt Readings from Last 3 Encounters:  10/19/22 173 lb (78.5 kg)  08/18/22 172 lb 6.4 oz (78.2 kg)  07/21/22 172 lb (78 kg)      Other studies Reviewed: Additional studies/ records that were reviewed today include: TTE 09/26/2020  Review of the above records today demonstrates:   1. Left ventricular ejection fraction, by estimation, is 65 to 70%. The  left ventricle has normal function. The left ventricle has no regional  wall motion abnormalities. Left ventricular diastolic parameters were  normal.   2. Right ventricular systolic function is normal. The right ventricular  size is normal. There is normal pulmonary artery systolic pressure.   3. The mitral valve is normal in structure. Mild mitral valve  regurgitation. No evidence of mitral stenosis.   4. The aortic valve is normal in structure. Aortic valve regurgitation is  not visualized. No aortic stenosis is present.    5. The inferior vena cava is normal in size with greater than 50%  respiratory variability, suggesting right atrial pressure of 3 mmHg.   Cardiac CT: LAD eccentric calcified plaque less than 50% Circumflex no significant stenosis RCA motion artifact throughout precludes accurate assessment Left main no significant narrowing  Cardiac monitor 05/04/2020 personally reviewed Max 187 bpm 07:27pm, 07/18 Min 52 bpm 05:22am, 07/18 Avg 73 bpm 1% PACs 13.4% PVCs 26 VT runs, longest 5 beats 9 SVT runs noted, longest 27 seconds at 116 bpm Triggered events associated with sinus rhythm, PVCs, SVT   ASSESSMENT AND PLAN:  1.  Paroxysmal atrial fibrillation: Currently on Xarelto and amiodarone, dose above.  CHA2DS2-VASc of 2.  Status post cryoablation.  Has continued to have more episodes of atrial fibrillation/flutter.  Is now status post repeat ablation 07/21/2022.  She has remained in sinus rhythm.  Zimir Kittleson stop both diltiazem and amiodarone today.  2.  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  13% burden on cardiac monitor.  Intermittent palpitations but otherwise feels fine.  No changes.  3.  Secondary hypercoagulable state: Currently on Xarelto for atrial fibrillation as above   Current medicines are reviewed at length with the patient today.   The patient does not have concerns regarding her medicines.  The following changes were made today: stop amiodarone, diltiazem  Labs/ tests ordered today include:  Orders Placed This Encounter  Procedures   EKG 12-Lead      Disposition:   FU 6 months  Signed, Sharon Rubis Meredith Leeds, MD  10/19/2022 3:36 PM     [place] [address] [place] [address] [phone] (office) [phone] (fax)

## 2022-11-03 ENCOUNTER — Telehealth: Payer: Self-pay | Admitting: Cardiology

## 2022-11-03 NOTE — Telephone Encounter (Signed)
Pt reports bilateral groin soreness after activity, including recent chest area, like she just had her ablation. Pt reports she is not having afib and doing much better since we stopped the medications (Amiodarone & Diltiazem). Example given was she was "just lifting plastic tubs with clothes in them", they weren't that heavy, but sites are sore afterward. Pt questions if this might be more muscle related being that she is moving more and getting out and about more since seeing [place], stopping meds and feeling much improved. Pt will continue monitoring. If continues she will follow up with PCP, but will reach out if occurs and we need to evaluate further. Patient verbalized understanding and agreeable to plan.

## 2022-11-03 NOTE — Telephone Encounter (Signed)
Patient calling to speak with Sherri. She says she has been off her medications and things are better, but she is having some soreness.

## 2022-11-03 NOTE — Telephone Encounter (Signed)
Patient stated she is still a little sore at the access site while walking however, its not sore to touch. Currently asymptomatic. Will forward message to RN

## 2022-12-30 ENCOUNTER — Encounter: Payer: Self-pay | Admitting: Cardiology

## 2023-02-19 ENCOUNTER — Other Ambulatory Visit: Payer: Self-pay | Admitting: Internal Medicine

## 2023-02-19 DIAGNOSIS — Z87891 Personal history of nicotine dependence: Secondary | ICD-10-CM

## 2023-02-22 ENCOUNTER — Other Ambulatory Visit: Payer: Self-pay | Admitting: Internal Medicine

## 2023-02-22 DIAGNOSIS — M8580 Other specified disorders of bone density and structure, unspecified site: Secondary | ICD-10-CM

## 2023-02-27 ENCOUNTER — Telehealth: Payer: Self-pay | Admitting: Cardiology

## 2023-02-27 ENCOUNTER — Other Ambulatory Visit: Payer: Self-pay | Admitting: Cardiology

## 2023-02-27 DIAGNOSIS — I480 Paroxysmal atrial fibrillation: Secondary | ICD-10-CM

## 2023-02-27 NOTE — Telephone Encounter (Signed)
Spoke to the patient, she had an tetanus injection on 5/20 that evening she started experience heart palpations. Pt stated her palpations were worse on 5/21 and has improved since then. Sunday night through Monday afternoon pt had mild chest pain, she did not seek medical attention. As of today, pt stated the CP has resolved however, she still feels kind of "off". Pt was prescribed amiodarone and diltiazem it was d/c'd on 10/19/22. Explained ED precautions, patient voiced understanding. Will forward to Afib clinic for advise.

## 2023-02-27 NOTE — Telephone Encounter (Signed)
Patient c/o Palpitations:  High priority if patient c/o lightheadedness, shortness of breath, or chest pain  How long have you had palpitations/irregular HR/ Afib? Are you having the symptoms now? Monday evening when it started. No longer out of rhythm.   Are you currently experiencing lightheadedness, SOB or CP? No   Do you have a history of afib (atrial fibrillation) or irregular heart rhythm? Yes.   Have you checked your BP or HR? (document readings if available): HR 75  Are you experiencing any other symptoms? No  Patient had palpitations after getting tetanus shot and would like to know if she needs to come in to see Dr. Elberta Fortis.

## 2023-02-27 NOTE — Telephone Encounter (Signed)
Spoke to the patient , explained Afib clinic recommendations:   Since palpations have resolved pt does not need to be seen in afib clinic. If chest pain should return would recommend cardiology follow up per Jorja Loa PA   Patient stated she did not mentioned chest pain, she was experiencing chest discomfort, A-flutter, and A-fib. However, the symptoms have resolved. Explained to the patient the Afib clinic recommendations. Pt voiced understanding.

## 2023-02-28 NOTE — Telephone Encounter (Signed)
Pt last saw Dr Elberta Fortis 10/19/22, last labs 07/04/22 Creat 0.81, age 67, weight 78.5kg, CrCl 81.23, based on CrCl pt is on appropriate dosage of Xarelto 20mg  QD for afib.  Will refill rx.

## 2023-03-01 ENCOUNTER — Other Ambulatory Visit: Payer: Self-pay | Admitting: Internal Medicine

## 2023-03-01 DIAGNOSIS — M8580 Other specified disorders of bone density and structure, unspecified site: Secondary | ICD-10-CM

## 2023-03-01 DIAGNOSIS — Z1231 Encounter for screening mammogram for malignant neoplasm of breast: Secondary | ICD-10-CM

## 2023-03-07 ENCOUNTER — Encounter (HOSPITAL_COMMUNITY): Payer: Self-pay | Admitting: Physician Assistant

## 2023-03-07 ENCOUNTER — Ambulatory Visit (HOSPITAL_COMMUNITY)
Admission: RE | Admit: 2023-03-07 | Discharge: 2023-03-07 | Disposition: A | Discharge status: Home / Self Care | Payer: Medicare Other | Source: Ambulatory Visit | Attending: Physician Assistant | Admitting: Physician Assistant

## 2023-03-07 VITALS — BP 130/82 | HR 70 | Ht 64.0 in | Wt 174.0 lb

## 2023-03-07 DIAGNOSIS — Z7901 Long term (current) use of anticoagulants: Secondary | ICD-10-CM | POA: Diagnosis not present

## 2023-03-07 DIAGNOSIS — I4892 Unspecified atrial flutter: Secondary | ICD-10-CM | POA: Diagnosis not present

## 2023-03-07 DIAGNOSIS — I480 Paroxysmal atrial fibrillation: Secondary | ICD-10-CM

## 2023-03-07 MED ORDER — DILTIAZEM HCL 30 MG PO TABS: ORAL_TABLET | ORAL | 1 refills | Status: DC

## 2023-03-07 NOTE — Progress Notes (Signed)
Primary Care Physician: Georgann Housekeeper, MD Primary Electrophysiologist: Dr Elberta Fortis Referring Physician: Dr Venida Jarvis is a 67 y.o. female with a history of MR, PVCs, atrial fibrillation who presents for follow up in the [facility].  The patient was initially diagnosed with atrial fibrillation 2018 and had a cryoablation in 2019. Patient is on Xarelto for a CHADS2VASC score of 2. She was seen by Dr Elberta Fortis 03/21/22 and was found to be in atrial flutter. She was started on diltiazem and scheduled for ablation. In the interim, she was started on amiodarone as a bridge to ablation.   Patient is s/p afib and flutter ablation with Dr Elberta Fortis on 07/21/22. Her amiodarone and diltiazem were discontinued 10/19/22.  On follow up today, patient had a tetanus shot on 5/20 and had intermittent palpitations for 3-4 days afterward. Her symptoms have now resolved. She is in SR today.   Today, she denies symptoms of chest pain, shortness of breath, orthopnea, PND, dizziness, presyncope, syncope, snoring, daytime somnolence, bleeding, or neurologic sequela.    Atrial Fibrillation Risk Factors:  she does not have symptoms or diagnosis of sleep apnea. she does not have a history of rheumatic fever. she does not have a history of alcohol use. The patient does not have a history of early familial atrial fibrillation or other arrhythmias.  she has a BMI of Body mass index is 29.87 kg/m.Marland Kitchen Filed Weights   03/07/23 0948  Weight: 78.9 kg   Family History  Problem Relation Age of Onset   CVA Mother    Stroke Mother    Heart disease Mother    Diabetes Mother    Cancer Mother    Cancer Father      Atrial Fibrillation Management history:  Previous antiarrhythmic drugs: sotalol, amiodarone  Previous cardioversions: 2019 Previous ablations: 2019 cyroablation, 07/21/22 CHADS2VASC score: 2 Anticoagulation history: Xarelto    Past Medical History:   Diagnosis Date   Long term (current) use of anticoagulants    Mitral valve regurgitation    Palpitations    Paroxysmal atrial fibrillation (HCC)    Pneumonia    HISTORY   Sinusitis, acute    Past Surgical History:  Procedure Laterality Date   ATRIAL FIBRILLATION ABLATION N/A 07/21/2022   Procedure: ATRIAL FIBRILLATION ABLATION;  Surgeon: Regan Lemming, MD;  Location: [facility] INVASIVE CV LAB;  Service: Cardiovascular;  Laterality: N/A;   CRYOABLATION     heart   HERNIA REPAIR     TONSILLECTOMY     TUBAL LIGATION      Current Outpatient Medications  Medication Sig Dispense Refill   Ascorbic Acid (VITAMIN C PO) Take 1 tablet by mouth daily.     B Complex Vitamins (VITAMIN B COMPLEX PO) Take by mouth.     BIOTIN PO Take 1 tablet by mouth daily.     Calcium-Vitamin D-Vitamin K (CALCIUM + D) [phone] MG-UNT-MCG CHEW See admin instructions.     Carboxymethylcellul-Glycerin (REFRESH OPTIVE OP) Place 1-2 vials into both eyes daily.     Cholecalciferol (VITAMIN D-3 PO) Take 1 capsule by mouth daily.     COLLAGEN PO Take 1 tablet by mouth daily.     Glucosamine HCl (GLUCOSAMINE PO) Take 1,000 mg by mouth daily. Shell fish free     levalbuterol (XOPENEX HFA) 45 MCG/ACT inhaler Inhale 1 puff into the lungs every 6 (six) hours as needed for wheezing or shortness of breath.     Multiple Vitamin (MULTIVITAMIN) capsule  Take 1 capsule by mouth daily.     Omega-3 Fatty Acids (FISH OIL PO) Take 1 capsule by mouth daily.      Probiotic Product (PROBIOTIC PO) Take 1 tablet by mouth daily.     rivaroxaban (XARELTO) 20 MG TABS tablet TAKE 1 TABLET BY MOUTH DAILY WITH SUPPER. 30 tablet 6   triamcinolone cream (KENALOG) 0.5 % Apply 1 Application topically as needed.     No current facility-administered medications for this encounter.    Allergies  Allergen Reactions   Doxycycline Hives, Other (See Comments) and Rash    Other reaction(s): Confusion (intolerance)   Iohexol Cough    Lips  tingling, difficulty swallowing, dry cough . Patient will need pre-meds before administration of contrast   Heart race that night.   Metoprolol Hives    Extreme fatigue   Nitrofurantoin Itching, Other (See Comments) and Swelling    Social History   Socioeconomic History   Marital status: Divorced    Spouse name: Not on file   Number of children: 2   Years of education: Not on file   Highest education level: Not on file  Occupational History   Occupation: RETIRED  Tobacco Use   Smoking status: Former    Packs/day: 1.00    Years: 30.00    Additional pack years: 0.00    Total pack years: 30.00    Types: Cigarettes    Quit date: 11/16/2007    Years since quitting: 15.3   Smokeless tobacco: Never   Tobacco comments:    Former smoker 04/11/22  Vaping Use   Vaping Use: Never used  Substance and Sexual Activity   Alcohol use: Not Currently    Alcohol/week: 2.0 standard drinks of alcohol    Types: 2 Glasses of wine per week    Comment: 1-2 glasses of wine weekly 04/11/22   Drug use: Never   Sexual activity: Not on file  Other Topics Concern   Not on file  Social History Narrative   Not on file   Social Determinants of Health   Financial Resource Strain: Not on file  Food Insecurity: Not on file  Transportation Needs: Not on file  Physical Activity: Not on file  Stress: Not on file  Social Connections: Not on file  Intimate Partner Violence: Not on file     ROS- All systems are reviewed and negative except as per the HPI above.  Physical Exam: Vitals:   03/07/23 0948  BP: 130/82  Pulse: 70  Weight: 78.9 kg  Height: 5\' 4"  (1.626 m)     GEN- The patient is a well appearing female, alert and oriented x 3 today.   HEENT-head normocephalic, atraumatic, sclera clear, conjunctiva pink, hearing intact, trachea midline. Lungs- Clear to ausculation bilaterally, normal work of breathing Heart- Regular rate and rhythm, no murmurs, rubs or gallops  GI- soft, NT, ND, +  BS Extremities- no clubbing, cyanosis, or edema MS- no significant deformity or atrophy Skin- no rash or lesion Psych- euthymic mood, full affect Neuro- strength and sensation are intact   Wt Readings from Last 3 Encounters:  03/07/23 78.9 kg  10/19/22 78.5 kg  08/18/22 78.2 kg    EKG today demonstrates  SR Vent. rate 70 BPM PR interval 182 ms QRS duration 70 ms QT/QTcB 410/442 ms  Echo 09/26/20 demonstrated   1. Left ventricular ejection fraction, by estimation, is 65 to 70%. The  left ventricle has normal function. The left ventricle has no regional  wall motion  abnormalities. Left ventricular diastolic parameters were  normal.   2. Right ventricular systolic function is normal. The right ventricular  size is normal. There is normal pulmonary artery systolic pressure.   3. The mitral valve is normal in structure. Mild mitral valve  regurgitation. No evidence of mitral stenosis.   4. The aortic valve is normal in structure. Aortic valve regurgitation is  not visualized. No aortic stenosis is present.   5. The inferior vena cava is normal in size with greater than 50%  respiratory variability, suggesting right atrial pressure of 3 mmHg.   Epic records are reviewed at length today  CHA2DS2-VASc Score = 2  The patient's score is based upon: CHF History: 0 HTN History: 0 Diabetes History: 0 Stroke History: 0 Vascular Disease History: 0 Age Score: 1 Gender Score: 1       ASSESSMENT AND PLAN: 1. Paroxysmal Atrial Fibrillation/atrial flutter The patient's CHA2DS2-VASc score is 2, indicating a 2.2% annual risk of stroke.   S/p afib and flutter ablation 07/21/22 Possible recent episodes related to vaccine although this would be uncommon.  We discussed rhythm control options. Will start diltiazem 30 mg PRN q 4 hours for heart racing.  Continue Xarelto 20 mg daily    Follow up with Dr Elberta Fortis as scheduled.    Jorja Loa PA-C Afib Clinic [facility] [address] [place], [place] [address] [phone] 03/07/2023 10:16 AM

## 2023-03-07 NOTE — Patient Instructions (Signed)
Cardizem 30mg  -- Take 1 tablet every 4 hours AS NEEDED for AFIB heart rate >90 as long as top BP >100.

## 2023-03-20 ENCOUNTER — Ambulatory Visit
Admission: RE | Admit: 2023-03-20 | Discharge: 2023-03-20 | Disposition: A | Discharge status: Home / Self Care | Payer: Medicare Other | Source: Ambulatory Visit | Attending: Internal Medicine | Admitting: Internal Medicine

## 2023-03-20 DIAGNOSIS — Z87891 Personal history of nicotine dependence: Secondary | ICD-10-CM

## 2023-04-09 ENCOUNTER — Encounter: Payer: Self-pay | Admitting: Cardiology

## 2023-04-16 ENCOUNTER — Ambulatory Visit: Payer: Medicare Other

## 2023-04-24 ENCOUNTER — Ambulatory Visit
Admission: RE | Admit: 2023-04-24 | Discharge: 2023-04-24 | Disposition: A | Discharge status: Home / Self Care | Payer: Medicare Other | Source: Ambulatory Visit | Attending: Internal Medicine | Admitting: Internal Medicine

## 2023-04-24 DIAGNOSIS — Z1231 Encounter for screening mammogram for malignant neoplasm of breast: Secondary | ICD-10-CM

## 2023-04-26 ENCOUNTER — Other Ambulatory Visit: Payer: Self-pay | Admitting: Obstetrics and Gynecology

## 2023-04-26 DIAGNOSIS — R928 Other abnormal and inconclusive findings on diagnostic imaging of breast: Secondary | ICD-10-CM

## 2023-05-03 ENCOUNTER — Ambulatory Visit
Admission: RE | Admit: 2023-05-03 | Discharge: 2023-05-03 | Disposition: A | Discharge status: Home / Self Care | Payer: Medicare Other | Source: Ambulatory Visit | Attending: Obstetrics and Gynecology | Admitting: Obstetrics and Gynecology

## 2023-05-03 ENCOUNTER — Encounter: Payer: Self-pay | Admitting: Podiatry

## 2023-05-03 ENCOUNTER — Ambulatory Visit (INDEPENDENT_AMBULATORY_CARE_PROVIDER_SITE_OTHER): Payer: Medicare Other | Admitting: Podiatry

## 2023-05-03 ENCOUNTER — Other Ambulatory Visit: Payer: Self-pay | Admitting: Obstetrics and Gynecology

## 2023-05-03 DIAGNOSIS — R928 Other abnormal and inconclusive findings on diagnostic imaging of breast: Secondary | ICD-10-CM

## 2023-05-03 DIAGNOSIS — M722 Plantar fascial fibromatosis: Secondary | ICD-10-CM

## 2023-05-03 DIAGNOSIS — N6310 Unspecified lump in the right breast, unspecified quadrant: Secondary | ICD-10-CM

## 2023-05-03 MED ORDER — TRIAMCINOLONE ACETONIDE 10 MG/ML IJ SUSP
10.0000 mg | Freq: Once | INTRAMUSCULAR | Status: AC
  Administered 2023-05-03: 10 mg via INTRA_ARTICULAR

## 2023-05-04 NOTE — Progress Notes (Signed)
Subjective:   Patient ID: Deborah Ortiz, female   DOB: 67 y.o.   MRN: [id number]   HPI Patient states overall doing well with the orthotics but having some discomfort in the plantar heel and arch she is doing stretches and elevating neuro   ROS      Objective:  Physical Exam  Vascular status intact with the patient found to have inflammation pain of a moderate nature with 1 area that is very discomforting plantar aspect right heel     Assessment:  Acute plantar fasciitis right that is improving but area still of soreness     Plan:  H&P reviewed working to do 1 more steroid injection I carefully did sterile prep and injected with 3 mg dexamethasone Kenalog 5 mg Xylocaine and advised on reduced activity continue orthotics reappoint as symptoms indicate

## 2023-05-07 ENCOUNTER — Ambulatory Visit (INDEPENDENT_AMBULATORY_CARE_PROVIDER_SITE_OTHER): Payer: Medicare Other

## 2023-05-07 ENCOUNTER — Other Ambulatory Visit: Payer: Medicare Other

## 2023-05-07 DIAGNOSIS — M779 Enthesopathy, unspecified: Secondary | ICD-10-CM

## 2023-05-07 DIAGNOSIS — M7752 Other enthesopathy of left foot: Secondary | ICD-10-CM | POA: Diagnosis not present

## 2023-05-07 DIAGNOSIS — M79671 Pain in right foot: Secondary | ICD-10-CM

## 2023-05-07 DIAGNOSIS — M722 Plantar fascial fibromatosis: Secondary | ICD-10-CM

## 2023-05-07 DIAGNOSIS — M7671 Peroneal tendinitis, right leg: Secondary | ICD-10-CM

## 2023-05-07 NOTE — Progress Notes (Signed)
Reviewed condition and went ahead and casted for functional orthotics we will add 3/8 of an inch to the right as we did last year Patient will be seen back when ready and will be fit orthotics will provide adequate support to BIL feet add total contact to BIL MLA's that will greater reduce plantar pressure and pain  Addison Bailey CPed, CFo, CFm

## 2023-05-08 ENCOUNTER — Ambulatory Visit: Admission: RE | Admit: 2023-05-08 | Payer: Medicare Other | Source: Ambulatory Visit

## 2023-05-08 ENCOUNTER — Encounter: Payer: Self-pay | Admitting: Cardiology

## 2023-05-08 ENCOUNTER — Ambulatory Visit
Admission: RE | Admit: 2023-05-08 | Discharge: 2023-05-08 | Disposition: A | Discharge status: Home / Self Care | Payer: Medicare Other | Source: Ambulatory Visit | Attending: Obstetrics and Gynecology | Admitting: Obstetrics and Gynecology

## 2023-05-08 ENCOUNTER — Ambulatory Visit: Payer: Medicare Other | Attending: Cardiology | Admitting: Cardiology

## 2023-05-08 VITALS — BP 160/98 | HR 76 | Ht 64.0 in | Wt 166.8 lb

## 2023-05-08 DIAGNOSIS — I480 Paroxysmal atrial fibrillation: Secondary | ICD-10-CM | POA: Insufficient documentation

## 2023-05-08 DIAGNOSIS — N6310 Unspecified lump in the right breast, unspecified quadrant: Secondary | ICD-10-CM

## 2023-05-08 DIAGNOSIS — Z79899 Other long term (current) drug therapy: Secondary | ICD-10-CM | POA: Insufficient documentation

## 2023-05-08 DIAGNOSIS — D6869 Other thrombophilia: Secondary | ICD-10-CM | POA: Diagnosis present

## 2023-05-08 DIAGNOSIS — I4819 Other persistent atrial fibrillation: Secondary | ICD-10-CM | POA: Diagnosis present

## 2023-05-08 DIAGNOSIS — I493 Ventricular premature depolarization: Secondary | ICD-10-CM

## 2023-05-08 HISTORY — PX: BREAST BIOPSY: SHX20

## 2023-05-08 LAB — BASIC METABOLIC PANEL
BUN/Creatinine Ratio: 27 (ref 12–28)
BUN: 20 mg/dL (ref 8–27)
CO2: 23 mmol/L (ref 20–29)
Calcium: 9.5 mg/dL (ref 8.7–10.3)
Chloride: 104 mmol/L (ref 96–106)
Creatinine, Ser: 0.75 mg/dL (ref 0.57–1.00)
Glucose: 91 mg/dL (ref 70–99)
Potassium: 4.3 mmol/L (ref 3.5–5.2)
Sodium: 142 mmol/L (ref 134–144)
eGFR: 86 mL/min/{1.73_m2} (ref >59)

## 2023-05-08 LAB — CBC

## 2023-05-08 NOTE — Patient Instructions (Signed)
Medication Instructions:  Your physician recommends that you continue on your current medications as directed. Please refer to the Current Medication list given to you today.  *If you need a refill on your cardiac medications before your next appointment, please call your pharmacy*   Lab Work: Today: BMET & CBC  If you have labs (blood work) drawn today and your tests are completely normal, you will receive your results only by: MyChart Message (if you have MyChart) OR A paper copy in the mail If you have any lab test that is abnormal or we need to change your treatment, we will call you to review the results.   Testing/Procedures: None ordered   Follow-Up: At [facility], you and your health needs are our priority.  As part of our continuing mission to provide you with exceptional heart care, we have created designated Provider Care Teams.  These Care Teams include your primary Cardiologist (physician) and Advanced Practice Providers (APPs -  Physician Assistants and Nurse Practitioners) who all work together to provide you with the care you need, when you need it.  Your next appointment:   6 month(s)  The format for your next appointment:   In Person  Provider:   You will follow up in the Atrial Fibrillation Clinic located at [facility]. Your provider will be: Clint R. Fenton, PA-C{ Or Landry Mellow, PA   Thank you for choosing [facility]!!   Dory Horn, RN [phone]  Other Instructions  Propafenone Tablets What is this medication? PROPAFENONE (proe pa FEEN one) prevents and treats a fast or irregular heartbeat (arrhythmia). It is often used to treat a type of arrhythmia known as AFib (atrial fibrillation). It works by slowing down overactive electric signals in the heart, which stabilizes your heart rhythm. It belongs to a group of medications called antiarrhythmics. This medicine may be used for other purposes; ask your health care provider or  pharmacist if you have questions. COMMON BRAND NAME(S): Rythmol What should I tell my care team before I take this medication? They need to know if you have any of these conditions: Heart disease High potassium level Kidney disease Liver disease Low blood pressure Lung or breathing disease like asthma, chronic bronchitis, or emphysema Pacemaker Slow heart rate An unusual or allergic reaction to propafenone, other medications, foods, dyes, or preservatives Pregnant or trying to get pregnant Breast-feeding How should I use this medication? Take this medication by mouth with a glass of water. Follow the directions on the prescription label. You can take this medication with or without food. Take your doses at regular intervals. Do not take your medication more often than directed. Do not stop taking except on the advice of your care team. Talk to your care team regarding the use of this medication in children. Special care may be needed. Overdosage: If you think you have taken too much of this medicine contact a poison control center or emergency room at once. NOTE: This medicine is only for you. Do not share this medicine with others. What if I miss a dose? If you miss a dose, take it as soon as you can. If it is almost time for your next dose, take only that dose. Do not take double or extra doses. What may interact with this medication? Do not take this medication with any of the following: Arsenic trioxide Certain antibiotics like clarithromycin, erythromycin, grepafloxacin, pentamidine, sparfloxacin, troleandomycin Certain medications for depression or mental illness like amoxapine, haloperidol, maprotiline, pimozide, sertindole,  thioridazine, tricyclic antidepressants Certain medications for fungal infections like fluconazole, itraconazole, ketoconazole, posaconazole, voriconazole Certain medications for irregular heartbeat like dronedarone Certain medications for malaria like  chloroquine, halofantrine Cisapride Droperidol Levomethadyl Ranolazine This medication may also interact with the following: Certain medications for angina or blood pressure Certain medications for asthma or breathing difficulties like formoterol, salmeterol Certain medications that treat or prevent blood clots like warfarin Cimetidine Cyclosporine Digoxin Diuretics Local anesthetics Other medications that prolong the QT interval (cause an abnormal heart rhythm) like dofetilide, ziprasidone Rifampin Ritonavir Theophylline This list may not describe all possible interactions. Give your health care provider a list of all the medicines, herbs, non-prescription drugs, or dietary supplements you use. Also tell them if you smoke, drink alcohol, or use illegal drugs. Some items may interact with your medicine. What should I watch for while using this medication? Your condition will be monitored closely when you first begin therapy. Often, this medication is first started in a hospital or other monitored health care setting. Once you are on maintenance therapy, visit your care team for regular checks on your progress. Because your condition and use of this medication carry some risk, it is a good idea to carry an identification card, necklace or bracelet with details of your condition, medications, and care team. You may get drowsy or dizzy. Do not drive, use machinery, or do anything that needs mental alertness until you know how this medication affects you. Do not stand or sit up quickly, especially if you are an older patient. This reduces the risk of dizzy or fainting spells. If you are going to have surgery, tell your care team that you are taking this medication. What side effects may I notice from receiving this medication? Side effects that you should report to your care team as soon as possible: Allergic reactions--skin rash, itching, hives, swelling of the face, lips, tongue, or  throat Heart failure--shortness of breath, swelling of the ankles, feet, or hands, sudden weight gain, unusual weakness or fatigue Heart rhythm changes--fast or irregular heartbeat, dizziness, feeling faint or lightheaded, chest pain, trouble breathing Infection--fever, chills, cough, sore throat Unusual bruising or bleeding Side effects that usually do not require medical attention (report to your care team if they continue or are bothersome): Change in taste Constipation Dizziness Fatigue Nausea This list may not describe all possible side effects. Call your doctor for medical advice about side effects. You may report side effects to FDA at 1-800-FDA-1088. Where should I keep my medication? Keep out of the reach of children and pets. Store at room temperature between 15 and 30 degrees C (59 and 86 degrees F). Protect from light. Keep container tightly closed. Throw away any unused medication after the expiration date. NOTE: This sheet is a summary. It may not cover all possible information. If you have questions about this medicine, talk to your doctor, pharmacist, or health care provider.  2024 Elsevier/Gold Standard (2021-01-26 00:00:00)

## 2023-05-08 NOTE — Progress Notes (Signed)
Electrophysiology Office Note:   Date:  05/08/2023  ID:  Deborah Ortiz 1956/03/24, MRN [id number]  Primary Cardiologist: None Electrophysiologist:  Jorja Loa, MD      History of Present Illness:   Deborah Ortiz is a 68 y.o. female with h/o atrial fibrillation seen today for routine electrophysiology followup.  Since last being seen in our clinic the patient reports increasing levels of fatigue.  She says that her and her primary physician are working on her thyroid and blood pressure control.  She is also noted increased palpitations.  She states that her heart rate gets into the 170s and 5 minutes later is back down into the 80s.  She feels short of breath and fatigue during these episodes.  She was started on as needed diltiazem by A-fib clinic..  she denies chest pain, dyspnea, PND, orthopnea, nausea, vomiting, dizziness, syncope, edema, weight gain, or early satiety.      She has a history seen for paroxysmal atrial fibrillation, mitral regurgitation, PVCs. November 2018 she started to feel poorly after nail infection. She was noted to be in atrial fibrillation was started on Xarelto and sotalol. She had a cryoablation in April 2019. She wore a cardiac monitor that showed a 13% PVC burden. She continued to have episodes of atrial fibrillation/flutter. She is post ablation 07/21/2022.        Review of systems complete and found to be negative unless listed in HPI.   EP Information / Studies Reviewed:    EKG is ordered today. Personal review as below.  EKG Interpretation Date/Time:  Tuesday May 08 2023 08:59:37 EDT Ventricular Rate:  76 PR Interval:  176 QRS Duration:  74 QT Interval:  398 QTC Calculation: 447 R Axis:   97  Text Interpretation: Normal sinus rhythm Rightward axis When compared with ECG of 07-Mar-2023 10:06, No significant change was found Confirmed by ,  ([id number]) on 05/08/2023 9:02:19 AM  Risk Assessment/Calculations:     CHA2DS2-VASc Score = 2    This indicates a 2.2% annual risk of stroke. The patient's score is based upon: CHF History: 0 HTN History: 0 Diabetes History: 0 Stroke History: 0 Vascular Disease History: 0 Age Score: 1 Gender Score: 1    Physical Exam:   VS:  BP (!) 160/98 (BP Location: Left Arm, Patient Position: Sitting, Cuff Size: Normal)   Pulse 76   Ht 5\' 4"  (1.626 m)   Wt 166 lb 12.8 oz (75.7 kg)   SpO2 98%   BMI 28.63 kg/m    Wt Readings from Last 3 Encounters:  05/08/23 166 lb 12.8 oz (75.7 kg)  03/07/23 174 lb (78.9 kg)  10/19/22 173 lb (78.5 kg)     GEN: Well nourished, well developed in no acute distress NECK: No JVD; No carotid bruits CARDIAC: Regular rate and rhythm, no murmurs, rubs, gallops RESPIRATORY:  Clear to auscultation without rales, wheezing or rhonchi  ABDOMEN: Soft, non-tender, non-distended EXTREMITIES:  No edema; No deformity   ASSESSMENT AND PLAN:    1.  Paroxysmal atrial fibrillation: Currently on Xarelto.  Status post cryoablation with repeat RF ablation 07/21/2022.  She is having short episodes of palpitations.  I have offered her propafenone 225 mg to see if this  improve her palpitations.  She  let us know if she wants to start it in the future.  2.  PVCs: 13% burden on cardiac monitor.  Intermittent palpitation but otherwise feels fine.  No changes.  3.  Secondary hypercoagulable state: Currently  on Xarelto for atrial fibrillation  4.  Hypertension: Blood pressure significantly elevated today.  She has been working on control with her primary physician.  5.  Hypothyroidism: Patient has fatigue.  She  discuss with her primary physician whether or not starting Synthroid would be reasonable.  Follow up with Afib Clinic in 6 months  Signed,  Jorja Loa, MD

## 2023-05-16 ENCOUNTER — Telehealth: Payer: Self-pay | Admitting: Podiatry

## 2023-05-16 NOTE — Telephone Encounter (Signed)
Lmom for pt to call back and schedule picking up orthotics.

## 2023-05-25 ENCOUNTER — Other Ambulatory Visit: Payer: Self-pay | Admitting: General Surgery

## 2023-05-25 DIAGNOSIS — N6341 Unspecified lump in right breast, subareolar: Secondary | ICD-10-CM

## 2023-05-26 ENCOUNTER — Other Ambulatory Visit: Payer: Self-pay

## 2023-05-26 ENCOUNTER — Emergency Department (HOSPITAL_COMMUNITY)
Admission: EM | Admit: 2023-05-26 | Discharge: 2023-05-27 | Disposition: A | Discharge status: Home / Self Care | Payer: Medicare Other | Attending: Emergency Medicine | Admitting: Emergency Medicine

## 2023-05-26 DIAGNOSIS — I480 Paroxysmal atrial fibrillation: Secondary | ICD-10-CM | POA: Diagnosis not present

## 2023-05-26 DIAGNOSIS — I10 Essential (primary) hypertension: Secondary | ICD-10-CM | POA: Diagnosis not present

## 2023-05-26 DIAGNOSIS — Z7901 Long term (current) use of anticoagulants: Secondary | ICD-10-CM | POA: Diagnosis not present

## 2023-05-26 DIAGNOSIS — Z79899 Other long term (current) drug therapy: Secondary | ICD-10-CM | POA: Diagnosis not present

## 2023-05-26 DIAGNOSIS — R42 Dizziness and giddiness: Secondary | ICD-10-CM | POA: Diagnosis present

## 2023-05-27 ENCOUNTER — Emergency Department (HOSPITAL_COMMUNITY): Payer: Medicare Other

## 2023-05-27 ENCOUNTER — Other Ambulatory Visit: Payer: Self-pay

## 2023-05-27 ENCOUNTER — Encounter (HOSPITAL_COMMUNITY): Payer: Self-pay

## 2023-05-27 DIAGNOSIS — I10 Essential (primary) hypertension: Secondary | ICD-10-CM | POA: Diagnosis not present

## 2023-05-27 LAB — CBC
HCT: 40.2 % (ref 36.0–46.0)
Hemoglobin: 13.7 g/dL (ref 12.0–15.0)
MCH: 32.3 pg (ref 26.0–34.0)
MCHC: 34.1 g/dL (ref 30.0–36.0)
MCV: 94.8 fL (ref 80.0–100.0)
Platelets: 194 10*3/uL (ref 150–400)
RBC: 4.24 MIL/uL (ref 3.87–5.11)
RDW: 12.9 % (ref 11.5–15.5)
WBC: 7.8 10*3/uL (ref 4.0–10.5)
nRBC: 0 % (ref 0.0–0.2)

## 2023-05-27 LAB — BASIC METABOLIC PANEL
Anion gap: 12 (ref 5–15)
BUN: 20 mg/dL (ref 8–23)
CO2: 22 mmol/L (ref 22–32)
Calcium: 9.2 mg/dL (ref 8.9–10.3)
Chloride: 102 mmol/L (ref 98–111)
Creatinine, Ser: 0.74 mg/dL (ref 0.44–1.00)
GFR, Estimated: >60 mL/min (ref >60)
Glucose, Bld: 114 mg/dL — ABNORMAL HIGH (ref 70–99)
Potassium: 3.7 mmol/L (ref 3.5–5.1)
Sodium: 136 mmol/L (ref 135–145)

## 2023-05-27 LAB — TROPONIN I (HIGH SENSITIVITY)
Troponin I (High Sensitivity): 5 ng/L (ref <18)
Troponin I (High Sensitivity): 5 ng/L (ref <18)

## 2023-05-27 NOTE — ED Triage Notes (Signed)
Pt to ED from home c/o hypertension and dizziness which began earlier this evening. Pt states her BP has been slowly increasing recently and she is concerned. Pt states "something's not right". Pt endorses lightheadedness upon ambulation. Arrives A+O.

## 2023-05-27 NOTE — ED Provider Notes (Signed)
[place] EMERGENCY DEPARTMENT AT [facility] Provider Note   CSN: [id number] Arrival date & time: 05/26/23  2305     History  Chief Complaint  Patient presents with   Hypertension    Deborah Ortiz is a 67 y.o. female.  The history is provided by the patient.   Patient with history of paroxysmal atrial fibrillation on anticoagulation, hypertension presents with multiple complaints.  Patient reports over the past day she has had elevated blood pressure.  She reports she takes losartan that was started earlier this month and has not missed any doses.  She is not on Cardizem or any other rate control. She reports increasing generalized fatigue and lightheadedness.  No syncope.  No focal weakness.  No chest pain or shortness of breath.  She did have an episode of palpitations that has improved. She reports she has a history of chronic fatigue syndrome Patient had mild headache that is improving   Past Medical History:  Diagnosis Date   Long term (current) use of anticoagulants    Mitral valve regurgitation    Palpitations    Paroxysmal atrial fibrillation (HCC)    Pneumonia    HISTORY   Sinusitis, acute     Home Medications Prior to Admission medications   Medication Sig Start Date End Date Taking? Authorizing Provider  Ascorbic Acid (VITAMIN C PO) Take 1 tablet by mouth daily.    [provider]  B Complex Vitamins (VITAMIN B COMPLEX PO) Take by mouth.    [provider]  BIOTIN PO Take 1 tablet by mouth daily.    [provider]  Calcium-Vitamin D-Vitamin K (CALCIUM + D) [phone] MG-UNT-MCG CHEW See admin instructions.    [provider]  Carboxymethylcellul-Glycerin (REFRESH OPTIVE OP) Place 1-2 vials into both eyes daily.    [provider]  Cholecalciferol (VITAMIN D-3 PO) Take 1 capsule by mouth daily.    [provider]  COLLAGEN PO Take 1 tablet by mouth daily.    [provider]   diltiazem (CARDIZEM) 30 MG tablet Take 1 tablet every 4 hours AS NEEDED for AFIB heart rate >90 as long as top BP >100. 03/07/23   Fenton, Clint R, PA  Glucosamine HCl (GLUCOSAMINE PO) Take 1,000 mg by mouth daily. Shell fish free    [provider]  levalbuterol (XOPENEX HFA) 45 MCG/ACT inhaler Inhale 1 puff into the lungs every 6 (six) hours as needed for wheezing or shortness of breath.    [provider]  Multiple Vitamin (MULTIVITAMIN) capsule Take 1 capsule by mouth daily.    [provider]  Omega-3 Fatty Acids (FISH OIL PO) Take 1 capsule by mouth daily.     [provider]  Probiotic Product (PROBIOTIC PO) Take 1 tablet by mouth daily.    [provider]  rivaroxaban (XARELTO) 20 MG TABS tablet TAKE 1 TABLET BY MOUTH DAILY WITH SUPPER. 02/28/23   Camnitz, Andree Coss, MD  triamcinolone cream (KENALOG) 0.5 % Apply 1 Application topically as needed.    [provider]      Allergies    Doxycycline, Iohexol, Metoprolol, Nitrofurantoin, and Tetanus toxoids    Review of Systems   Review of Systems  Constitutional:  Negative for fever.  Respiratory:  Negative for shortness of breath.   Cardiovascular:  Positive for palpitations. Negative for chest pain.  Neurological:  Positive for light-headedness. Negative for syncope and speech difficulty.    Physical Exam Updated Vital Signs BP Marland Kitchen)  157/80   Pulse 63   Temp (!) 97.5 F (36.4 C) (Oral)   Resp 16   SpO2 97%  Physical Exam CONSTITUTIONAL: Well developed/well nourished HEAD: Normocephalic/atraumatic EYES: EOMI/PERRL, no nystagmus,  no ptosis ENMT: Mucous membranes moist NECK: supple no meningeal signs CV: S1/S2 noted, no murmurs/rubs/gallops noted LUNGS: Lungs are clear to auscultation bilaterally, no apparent distress ABDOMEN: soft, nontender, no rebound or guarding GU:no cva tenderness NEURO:Awake/alert, face symmetric, no arm or leg drift is noted Equal 5/5 strength  with shoulder abduction, elbow flex/extension, wrist flex/extension in upper extremities and equal hand grips bilaterally Equal 5/5 strength with hip flexion,knee flex/extension, foot dorsi/plantar flexion Cranial nerves 3/4/5/6/04/09/09/11/12 tested and intact Gait normal without ataxia No past pointing Sensation to light touch intact in all extremities EXTREMITIES: pulses normal, full ROM SKIN: warm, color normal PSYCH: no abnormalities of mood noted   ED Results / Procedures / Treatments   Labs (all labs ordered are listed, but only abnormal results are displayed) Labs Reviewed  BASIC METABOLIC PANEL - Abnormal; Notable for the following components:      Result Value   Glucose, Bld 114 (*)    All other components within normal limits  CBC  TROPONIN I (HIGH SENSITIVITY)  TROPONIN I (HIGH SENSITIVITY)    EKG EKG Interpretation Date/Time:  Saturday May 26 2023 23:32:49 EDT Ventricular Rate:  80 PR Interval:  172 QRS Duration:  70 QT Interval:  384 QTC Calculation: 442 R Axis:   90  Text Interpretation: Normal sinus rhythm Rightward axis Borderline ECG No significant change since last tracing Confirmed by Zadie Rhine ([id number]) on 05/27/2023 2:56:00 AM  Radiology DG Chest 2 View  Result Date: 05/27/2023 CLINICAL DATA:  Hypertension. EXAM: CHEST - 2 VIEW COMPARISON:  PA chest with rib series 04/25/2021, lung cancer screening chest CT 03/20/2023. FINDINGS: The cardiac size is normal. There is a tortuous aorta with stable mediastinum, scattered calcifications in the aorta. No vascular congestion is seen. The lungs are mildly emphysematous but clear. There is osteopenia. Mild thoracic spondylosis, and chronic reverse S shaped thoracolumbar scoliosis. IMPRESSION: No evidence of acute chest disease. Stable COPD chest with aortic atherosclerosis. Electronically Signed   By: Almira Bar M.D.   On: 05/27/2023 00:55    Procedures Procedures    Medications Ordered in  ED Medications - No data to display  ED Course/ Medical Decision Making/ A&P                                 Medical Decision Making Amount and/or Complexity of Data Reviewed Labs: ordered. Radiology: ordered.   This patient presents to the ED for concern of lightheadedness, this involves an extensive number of treatment options, and is a complaint that carries with it a high risk of complications and morbidity.  The differential diagnosis includes but is not limited to CVA, intracranial hemorrhage, acute coronary syndrome, renal failure, urinary tract infection, electrolyte disturbance, pneumonia Atrial fibrillation  Comorbidities that complicate the patient evaluation: Patient's presentation is complicated by their history of hypertension and atrial fibrillation   Additional history obtained:  Records reviewed  cardiology notes reviewed  Lab Tests: I Ordered, and personally interpreted labs.  The pertinent results include: Labs overall unremarkable  Imaging Studies ordered: I ordered imaging studies including X-ray chest   I independently visualized and interpreted imaging which showed no acute findings I agree with the radiologist interpretation   Reevaluation: After the interventions  noted above, I reevaluated the patient and found that they have :improved  Complexity of problems addressed: Patient's presentation is most consistent with  acute presentation with potential threat to life or bodily function  Disposition: After consideration of the diagnostic results and the patient's response to treatment,  I feel that the patent would benefit from discharge   .    Patient reports generalized weakness, lightheadedness and mild headache and she notes her blood pressure was elevated.  She reports taking her losartan as prescribed but was just started earlier this month.  She has no focal findings.  She is ambulatory.  Labs overall reassuring.  In the ED she appears to  remained in sinus rhythm Advised patient to continue to monitor her BP as an outpatient and call her PCP in 24 hours.  Since she just started losartan this month, will defer any changes at this time.        Final Clinical Impression(s) / ED Diagnoses Final diagnoses:  Primary hypertension    Rx / DC Orders ED Discharge Orders     None         Zadie Rhine, MD 05/27/23 [phone]

## 2023-05-28 ENCOUNTER — Telehealth: Payer: Self-pay | Admitting: *Deleted

## 2023-05-28 NOTE — Telephone Encounter (Signed)
Please advise holding Xarelto prior to right breast seed guided excisional biopsy.  Thank you!  DW

## 2023-05-28 NOTE — Telephone Encounter (Signed)
Dr. Elberta Fortis,  You saw this patient on 05/08/2023. Will you please comment on medical clearance for right breast seed guided excisional biopsy?  Please route your response to P CV DIV Preop. I will communicate with requesting office once you have given recommendations.   Thank you!  Carlos Levering, NP

## 2023-05-28 NOTE — Telephone Encounter (Signed)
   Pre-operative Risk Assessment    Patient Name: Deborah Ortiz [facility]  DOB: 02-28-1956 MRN: [id number]      Request for Surgical Clearance    Procedure:   RIGHT BREAST SEED GUIDED EXCISIONAL BIOPSY  Date of Surgery:  Clearance TBD                                 Surgeon:  Emelia Loron, MD Surgeon's Group or Practice Name:  [facility] Phone number:  [phone] Fax number:  [phone]   Type of Clearance Requested:   - Medical  - Pharmacy:  Hold Rivaroxaban (Xarelto) NOT INDICATED   Type of Anesthesia:  General    Additional requests/questions:    [name]   05/28/2023, 3:50 PM

## 2023-05-29 ENCOUNTER — Encounter: Payer: Self-pay | Admitting: Cardiology

## 2023-05-29 NOTE — Telephone Encounter (Signed)
   Name: Deborah Ortiz [facility]  DOB: 15-Mar-1956  MRN: [id number]   Primary Cardiologist: None  Chart reviewed as part of pre-operative protocol coverage. Deborah Ortiz was last seen on 05/08/2023 by Dr. Elberta Fortis.  Per Dr. Elberta Fortis "Patient is without cardiac complaints other than palpitations.  Score of 27 with a normal ejection fraction.  She will be at low to intermediate risk for this intermediate risk procedure.  No further cardiac testing is necessary."  Per pharm D, patient may hold Xarelto for 2 days prior to procedure.   I will route this recommendation to the requesting party via Epic fax function and remove from pre-op pool. Please call with questions.  Carlos Levering, NP 05/29/2023, 11:19 AM

## 2023-05-29 NOTE — Telephone Encounter (Signed)
Patient with diagnosis of afib on Xarelto for anticoagulation.    Procedure: right breast seed guided excisional biopsy  Date of procedure: TBD  CHA2DS2-VASc Score = 3  This indicates a 3.2% annual risk of stroke. The patient's score is based upon: CHF History: 0 HTN History: 1 Diabetes History: 0 Stroke History: 0 Vascular Disease History: 0 Age Score: 1 Gender Score: 1   CrCl 64mL/min using adjusted body weight Platelet count 194K  Per office protocol, patient can hold Xarelto for 2 days prior to procedure.    **This guidance is not considered finalized until pre-operative APP has relayed final recommendations.**

## 2023-05-30 ENCOUNTER — Other Ambulatory Visit: Payer: Self-pay | Admitting: General Surgery

## 2023-05-30 DIAGNOSIS — N6341 Unspecified lump in right breast, subareolar: Secondary | ICD-10-CM

## 2023-06-01 ENCOUNTER — Other Ambulatory Visit: Payer: Self-pay

## 2023-06-01 ENCOUNTER — Encounter (HOSPITAL_BASED_OUTPATIENT_CLINIC_OR_DEPARTMENT_OTHER): Payer: Self-pay | Admitting: General Surgery

## 2023-06-01 NOTE — Progress Notes (Signed)
   06/01/23 1130  PAT Phone Screen  Is the patient taking a GLP-1 receptor agonist? No  Do You Have Diabetes? No  Do You Have Hypertension? Yes  Have You Ever Been to the ER for Asthma? No  Have You Taken Oral Steroids in the Past 3 Months? No  Do you Take Phenteramine or any Other Diet Drugs? No  Recent  Lab Work, EKG, CXR? Yes  Where was this test performed? [facility] 05/27/23 EKG 05/28/23 NSR  Do you have a history of heart problems? Yes  Cardiologist Name Dr Elberta Fortis OV 05/2023 Clearance on chart hx of paf ablations 2019 & 2023  Have you ever had tests on your heart? Yes  What cardiac tests were performed? Echo  What date/year were cardiac tests completed? Echo EF 65-70% 09/26/20  Results viewable: [facility] Media Tab  Any Recent Hospitalizations? No  Height 5\' 4"  (1.626 m)  Weight 75.7 kg  Pat Appointment Scheduled No  Reason for No Appointment Not Needed

## 2023-06-08 ENCOUNTER — Ambulatory Visit
Admission: RE | Admit: 2023-06-08 | Discharge: 2023-06-08 | Disposition: A | Discharge status: Home / Self Care | Payer: Medicare Other | Source: Ambulatory Visit | Attending: General Surgery | Admitting: General Surgery

## 2023-06-08 DIAGNOSIS — N6341 Unspecified lump in right breast, subareolar: Secondary | ICD-10-CM

## 2023-06-08 HISTORY — PX: BREAST BIOPSY: SHX20

## 2023-06-11 ENCOUNTER — Ambulatory Visit
Admission: RE | Admit: 2023-06-11 | Discharge: 2023-06-11 | Disposition: A | Discharge status: Home / Self Care | Payer: Medicare Other | Source: Ambulatory Visit | Attending: General Surgery | Admitting: General Surgery

## 2023-06-11 ENCOUNTER — Ambulatory Visit (HOSPITAL_BASED_OUTPATIENT_CLINIC_OR_DEPARTMENT_OTHER): Payer: Medicare Other | Admitting: Certified Registered"

## 2023-06-11 ENCOUNTER — Encounter (HOSPITAL_BASED_OUTPATIENT_CLINIC_OR_DEPARTMENT_OTHER)
Admission: RE | Disposition: A | Discharge status: Home / Self Care | Payer: Self-pay | Source: Home / Self Care | Attending: General Surgery

## 2023-06-11 ENCOUNTER — Ambulatory Visit (HOSPITAL_BASED_OUTPATIENT_CLINIC_OR_DEPARTMENT_OTHER): Payer: Self-pay | Admitting: Certified Registered"

## 2023-06-11 ENCOUNTER — Encounter (HOSPITAL_BASED_OUTPATIENT_CLINIC_OR_DEPARTMENT_OTHER): Payer: Self-pay | Admitting: General Surgery

## 2023-06-11 ENCOUNTER — Ambulatory Visit (HOSPITAL_BASED_OUTPATIENT_CLINIC_OR_DEPARTMENT_OTHER)
Admission: RE | Admit: 2023-06-11 | Discharge: 2023-06-11 | Disposition: A | Discharge status: Home / Self Care | Payer: Medicare Other | Attending: General Surgery | Admitting: General Surgery

## 2023-06-11 ENCOUNTER — Other Ambulatory Visit: Payer: Self-pay

## 2023-06-11 DIAGNOSIS — I4891 Unspecified atrial fibrillation: Secondary | ICD-10-CM | POA: Diagnosis not present

## 2023-06-11 DIAGNOSIS — N6310 Unspecified lump in the right breast, unspecified quadrant: Secondary | ICD-10-CM | POA: Diagnosis not present

## 2023-06-11 DIAGNOSIS — J449 Chronic obstructive pulmonary disease, unspecified: Secondary | ICD-10-CM | POA: Diagnosis not present

## 2023-06-11 DIAGNOSIS — I10 Essential (primary) hypertension: Secondary | ICD-10-CM | POA: Insufficient documentation

## 2023-06-11 DIAGNOSIS — D241 Benign neoplasm of right breast: Secondary | ICD-10-CM | POA: Insufficient documentation

## 2023-06-11 DIAGNOSIS — N6341 Unspecified lump in right breast, subareolar: Secondary | ICD-10-CM | POA: Diagnosis present

## 2023-06-11 DIAGNOSIS — Z79899 Other long term (current) drug therapy: Secondary | ICD-10-CM | POA: Diagnosis not present

## 2023-06-11 DIAGNOSIS — N6489 Other specified disorders of breast: Secondary | ICD-10-CM | POA: Insufficient documentation

## 2023-06-11 DIAGNOSIS — Z87891 Personal history of nicotine dependence: Secondary | ICD-10-CM | POA: Insufficient documentation

## 2023-06-11 DIAGNOSIS — Z7901 Long term (current) use of anticoagulants: Secondary | ICD-10-CM | POA: Insufficient documentation

## 2023-06-11 HISTORY — PX: RADIOACTIVE SEED GUIDED EXCISIONAL BREAST BIOPSY: SHX6490

## 2023-06-11 HISTORY — DX: Essential (primary) hypertension: I10

## 2023-06-11 HISTORY — DX: Unspecified cataract: H26.9

## 2023-06-11 SURGERY — RADIOACTIVE SEED GUIDED BREAST BIOPSY
Anesthesia: General | Site: Breast | Laterality: Right

## 2023-06-11 MED ORDER — PROPOFOL 10 MG/ML IV BOLUS
INTRAVENOUS | Status: DC | PRN
  Administered 2023-06-11: 150 mg via INTRAVENOUS

## 2023-06-11 MED ORDER — ACETAMINOPHEN 160 MG/5ML PO SOLN: 325.0000 mg | ORAL | Status: DC | PRN

## 2023-06-11 MED ORDER — ONDANSETRON HCL 4 MG/2ML IJ SOLN
INTRAMUSCULAR | Status: DC | PRN
  Administered 2023-06-11: 4 mg via INTRAVENOUS

## 2023-06-11 MED ORDER — MIDAZOLAM HCL 5 MG/5ML IJ SOLN
INTRAMUSCULAR | Status: DC | PRN
  Administered 2023-06-11: 1 mg via INTRAVENOUS

## 2023-06-11 MED ORDER — ACETAMINOPHEN 500 MG PO TABS
1000.0000 mg | ORAL_TABLET | ORAL | Status: AC
  Administered 2023-06-11: 1000 mg via ORAL

## 2023-06-11 MED ORDER — DEXAMETHASONE SODIUM PHOSPHATE 10 MG/ML IJ SOLN
INTRAMUSCULAR | Status: AC
  Filled 2023-06-11: qty 1

## 2023-06-11 MED ORDER — EPHEDRINE 5 MG/ML INJ
INTRAVENOUS | Status: AC
  Filled 2023-06-11: qty 5

## 2023-06-11 MED ORDER — ENSURE PRE-SURGERY PO LIQD: 296.0000 mL | Freq: Once | ORAL | Status: DC

## 2023-06-11 MED ORDER — OXYCODONE HCL 5 MG PO TABS: 5.0000 mg | ORAL_TABLET | Freq: Once | ORAL | Status: DC | PRN

## 2023-06-11 MED ORDER — ONDANSETRON HCL 4 MG/2ML IJ SOLN
INTRAMUSCULAR | Status: AC
  Filled 2023-06-11: qty 2

## 2023-06-11 MED ORDER — FENTANYL CITRATE (PF) 100 MCG/2ML IJ SOLN
INTRAMUSCULAR | Status: DC | PRN
  Administered 2023-06-11: 50 ug via INTRAVENOUS

## 2023-06-11 MED ORDER — MIDAZOLAM HCL 2 MG/2ML IJ SOLN
INTRAMUSCULAR | Status: AC
  Filled 2023-06-11: qty 2

## 2023-06-11 MED ORDER — FENTANYL CITRATE (PF) 100 MCG/2ML IJ SOLN
INTRAMUSCULAR | Status: AC
  Filled 2023-06-11: qty 2

## 2023-06-11 MED ORDER — ACETAMINOPHEN 500 MG PO TABS
ORAL_TABLET | ORAL | Status: AC
  Filled 2023-06-11: qty 2

## 2023-06-11 MED ORDER — MEPERIDINE HCL 25 MG/ML IJ SOLN: 6.2500 mg | INTRAMUSCULAR | Status: DC | PRN

## 2023-06-11 MED ORDER — BUPIVACAINE-EPINEPHRINE 0.25% -1:200000 IJ SOLN
INTRAMUSCULAR | Status: DC | PRN
Start: 2023-06-11 — End: 2023-06-11
  Administered 2023-06-11: 10 mL

## 2023-06-11 MED ORDER — CEFAZOLIN SODIUM-DEXTROSE 2-4 GM/100ML-% IV SOLN
2.0000 g | INTRAVENOUS | Status: AC
  Administered 2023-06-11: 2 g via INTRAVENOUS

## 2023-06-11 MED ORDER — LIDOCAINE 2% (20 MG/ML) 5 ML SYRINGE
INTRAMUSCULAR | Status: AC
  Filled 2023-06-11: qty 5

## 2023-06-11 MED ORDER — CHLORHEXIDINE GLUCONATE CLOTH 2 % EX PADS: 6.0000 | MEDICATED_PAD | Freq: Once | CUTANEOUS | Status: DC

## 2023-06-11 MED ORDER — DEXAMETHASONE SODIUM PHOSPHATE 10 MG/ML IJ SOLN
INTRAMUSCULAR | Status: DC | PRN
  Administered 2023-06-11: 10 mg via INTRAVENOUS

## 2023-06-11 MED ORDER — ACETAMINOPHEN 325 MG PO TABS: 325.0000 mg | ORAL_TABLET | ORAL | Status: DC | PRN

## 2023-06-11 MED ORDER — LIDOCAINE HCL (CARDIAC) PF 100 MG/5ML IV SOSY
PREFILLED_SYRINGE | INTRAVENOUS | Status: DC | PRN
  Administered 2023-06-11: 60 mg via INTRAVENOUS

## 2023-06-11 MED ORDER — FENTANYL CITRATE (PF) 100 MCG/2ML IJ SOLN: 25.0000 ug | INTRAMUSCULAR | Status: DC | PRN

## 2023-06-11 MED ORDER — LACTATED RINGERS IV SOLN: INTRAVENOUS | Status: DC

## 2023-06-11 MED ORDER — EPHEDRINE SULFATE (PRESSORS) 50 MG/ML IJ SOLN
INTRAMUSCULAR | Status: DC | PRN
  Administered 2023-06-11: 5 mg via INTRAVENOUS

## 2023-06-11 MED ORDER — PROPOFOL 500 MG/50ML IV EMUL
INTRAVENOUS | Status: DC | PRN
Start: 2023-06-11 — End: 2023-06-11
  Administered 2023-06-11: 175 ug/kg/min via INTRAVENOUS

## 2023-06-11 MED ORDER — OXYCODONE HCL 5 MG/5ML PO SOLN: 5.0000 mg | Freq: Once | ORAL | Status: DC | PRN

## 2023-06-11 MED ORDER — ONDANSETRON HCL 4 MG/2ML IJ SOLN: 4.0000 mg | Freq: Once | INTRAMUSCULAR | Status: DC | PRN

## 2023-06-11 MED ORDER — CEFAZOLIN SODIUM-DEXTROSE 2-4 GM/100ML-% IV SOLN
INTRAVENOUS | Status: AC
  Filled 2023-06-11: qty 100

## 2023-06-11 SURGICAL SUPPLY — 59 items
ADH SKN CLS APL DERMABOND .7 (GAUZE/BANDAGES/DRESSINGS) ×1
APL PRP STRL LF DISP 70% ISPRP (MISCELLANEOUS) ×1
APPLIER CLIP 9.375 MED OPEN (MISCELLANEOUS)
APR CLP MED 9.3 20 MLT OPN (MISCELLANEOUS)
BINDER BREAST LRG (GAUZE/BANDAGES/DRESSINGS) IMPLANT
BINDER BREAST MEDIUM (GAUZE/BANDAGES/DRESSINGS) IMPLANT
BINDER BREAST XLRG (GAUZE/BANDAGES/DRESSINGS) IMPLANT
BINDER BREAST XXLRG (GAUZE/BANDAGES/DRESSINGS) IMPLANT
BLADE SURG 15 STRL LF DISP TIS (BLADE) ×1 IMPLANT
BLADE SURG 15 STRL SS (BLADE) ×1
CANISTER SUC SOCK COL 7IN (MISCELLANEOUS) IMPLANT
CANISTER SUCT 1200ML W/VALVE (MISCELLANEOUS) IMPLANT
CHLORAPREP W/TINT 26 (MISCELLANEOUS) ×1 IMPLANT
CLIP APPLIE 9.375 MED OPEN (MISCELLANEOUS) IMPLANT
CLIP TI WIDE RED SMALL 6 (CLIP) IMPLANT
COVER BACK TABLE 60X90IN (DRAPES) ×1 IMPLANT
COVER MAYO STAND STRL (DRAPES) ×1 IMPLANT
COVER PROBE CYLINDRICAL 5X96 (MISCELLANEOUS) ×1 IMPLANT
DERMABOND ADVANCED .7 DNX12 (GAUZE/BANDAGES/DRESSINGS) ×1 IMPLANT
DRAPE LAPAROSCOPIC ABDOMINAL (DRAPES) ×1 IMPLANT
DRAPE UTILITY XL STRL (DRAPES) ×1 IMPLANT
DRSG TEGADERM 4X4.75 (GAUZE/BANDAGES/DRESSINGS) IMPLANT
ELECT COATED BLADE 2.86 ST (ELECTRODE) ×1 IMPLANT
ELECT REM PT RETURN 9FT ADLT (ELECTROSURGICAL) ×1
ELECTRODE REM PT RTRN 9FT ADLT (ELECTROSURGICAL) ×1 IMPLANT
GAUZE SPONGE 4X4 12PLY STRL LF (GAUZE/BANDAGES/DRESSINGS) IMPLANT
GLOVE BIO SURGEON STRL SZ7 (GLOVE) ×2 IMPLANT
GLOVE BIOGEL PI IND STRL 7.0 (GLOVE) IMPLANT
GLOVE BIOGEL PI IND STRL 7.5 (GLOVE) ×1 IMPLANT
GLOVE SURG SYN 7.5 E (GLOVE) ×1
GLOVE SURG SYN 7.5 PF PI (GLOVE) IMPLANT
GOWN STRL REUS W/ TWL LRG LVL3 (GOWN DISPOSABLE) ×2 IMPLANT
GOWN STRL REUS W/ TWL XL LVL3 (GOWN DISPOSABLE) IMPLANT
GOWN STRL REUS W/TWL LRG LVL3 (GOWN DISPOSABLE) ×1
GOWN STRL REUS W/TWL XL LVL3 (GOWN DISPOSABLE) ×1
HEMOSTAT ARISTA ABSORB 3G PWDR (HEMOSTASIS) IMPLANT
KIT MARKER MARGIN INK (KITS) ×1 IMPLANT
NDL HYPO 25X1 1.5 SAFETY (NEEDLE) ×1 IMPLANT
NEEDLE HYPO 25X1 1.5 SAFETY (NEEDLE) ×1
NS IRRIG 1000ML POUR BTL (IV SOLUTION) IMPLANT
PACK BASIN DAY SURGERY FS (CUSTOM PROCEDURE TRAY) ×1 IMPLANT
PENCIL SMOKE EVACUATOR (MISCELLANEOUS) ×1 IMPLANT
RETRACTOR ONETRAX LX 90X20 (MISCELLANEOUS) IMPLANT
SLEEVE SCD COMPRESS KNEE MED (STOCKING) ×1 IMPLANT
SPIKE FLUID TRANSFER (MISCELLANEOUS) IMPLANT
SPONGE T-LAP 4X18 ~~LOC~~+RFID (SPONGE) ×1 IMPLANT
STRIP CLOSURE SKIN 1/2X4 (GAUZE/BANDAGES/DRESSINGS) ×1 IMPLANT
SUT MNCRL AB 4-0 PS2 18 (SUTURE) IMPLANT
SUT MON AB 5-0 PS2 18 (SUTURE) IMPLANT
SUT SILK 2 0 SH (SUTURE) IMPLANT
SUT VIC AB 2-0 SH 27 (SUTURE) ×1
SUT VIC AB 2-0 SH 27XBRD (SUTURE) ×1 IMPLANT
SUT VIC AB 3-0 SH 27 (SUTURE) ×1
SUT VIC AB 3-0 SH 27X BRD (SUTURE) ×1 IMPLANT
SYR CONTROL 10ML LL (SYRINGE) ×1 IMPLANT
TOWEL GREEN STERILE FF (TOWEL DISPOSABLE) ×1 IMPLANT
TRAY FAXITRON CT DISP (TRAY / TRAY PROCEDURE) ×1 IMPLANT
TUBE CONNECTING 20X1/4 (TUBING) IMPLANT
YANKAUER SUCT BULB TIP NO VENT (SUCTIONS) IMPLANT

## 2023-06-11 NOTE — Transfer of Care (Signed)
Immediate Anesthesia Transfer of Care Note  Patient: Deborah Ortiz  Procedure(s) Performed: RIGHT BREAST SEED GUIDED EXCISIONAL BIOPSY (Right: Breast)  Patient Location: PACU  Anesthesia Type:General  Level of Consciousness: awake, alert , and oriented  Airway & Oxygen Therapy: Patient Spontanous Breathing and Patient connected to face mask oxygen  Post-op Assessment: Report given to RN and Post -op Vital signs reviewed and stable  Post vital signs: Reviewed and stable  Last Vitals:  Vitals Value Taken Time  BP 122/76 06/11/23 0940  Temp    Pulse 72 06/11/23 0941  Resp 17 06/11/23 0941  SpO2 100 % 06/11/23 0941  Vitals shown include unfiled device data.  Last Pain:  Vitals:   06/11/23 0756  TempSrc: Temporal  PainSc: 0-No pain         Complications: No notable events documented.

## 2023-06-11 NOTE — Interval H&P Note (Signed)
History and Physical Interval Note:  06/11/2023 8:46 AM  [name]  has presented today for surgery, with the diagnosis of RIGHT BREAST MASS.  The various methods of treatment have been discussed with the patient and family. After consideration of risks, benefits and other options for treatment, the patient has consented to  Procedure(s) with comments: RIGHT BREAST SEED GUIDED EXCISIONAL BIOPSY (Right) - LMA as a surgical intervention.  The patient's history has been reviewed, patient examined, no change in status, stable for surgery.  I have reviewed the patient's chart and labs.  Questions were answered to the patient's satisfaction.     Deborah Ortiz

## 2023-06-11 NOTE — Anesthesia Preprocedure Evaluation (Addendum)
Anesthesia Evaluation  Patient identified by MRN, date of birth, ID band Patient awake    Reviewed: Allergy & Precautions, NPO status , Patient's Chart, lab work & pertinent test results  Airway Mallampati: II  TM Distance: >3 FB Neck ROM: Full    Dental no notable dental hx. (+) Teeth Intact, Dental Advisory Given, Caps   Pulmonary pneumonia, resolved, COPD,  COPD inhaler, former smoker   Pulmonary exam normal breath sounds clear to auscultation       Cardiovascular Exercise Tolerance: Good hypertension, Pt. on medications Normal cardiovascular exam+ dysrhythmias Atrial Fibrillation  Rhythm:Irregular Rate:Normal  EKG 05/31/22 NSR, Normal  Hx/o atrial fibrillation S/P ablation   Neuro/Psych negative neurological ROS  negative psych ROS   GI/Hepatic negative GI ROS, Neg liver ROS,,,  Endo/Other    Renal/GU negative Renal ROS  negative genitourinary   Musculoskeletal   Abdominal   Peds  Hematology Xarelto therapy- last dose last pm   Anesthesia Other Findings   Reproductive/Obstetrics                              Anesthesia Physical Anesthesia Plan  ASA: 2  Anesthesia Plan: General   Post-op Pain Management: Minimal or no pain anticipated   Induction: Intravenous  PONV Risk Score and Plan: 3 and Treatment may vary due to age or medical condition, Ondansetron and Dexamethasone  Airway Management Planned: Oral ETT and LMA  Additional Equipment: None  Intra-op Plan:   Post-operative Plan: Extubation in OR  Informed Consent: I have reviewed the patients History and Physical, chart, labs and discussed the procedure including the risks, benefits and alternatives for the proposed anesthesia with the patient or authorized representative who has indicated his/her understanding and acceptance.     Dental advisory given  Plan Discussed with: CRNA and Anesthesiologist  Anesthesia  Plan Comments:          Anesthesia Quick Evaluation

## 2023-06-11 NOTE — Anesthesia Postprocedure Evaluation (Signed)
Anesthesia Post Note  Patient: Deborah Ortiz  Procedure(s) Performed: RIGHT BREAST SEED GUIDED EXCISIONAL BIOPSY (Right: Breast)     Patient location during evaluation: PACU Anesthesia Type: General Level of consciousness: awake and alert Pain management: pain level controlled Vital Signs Assessment: post-procedure vital signs reviewed and stable Respiratory status: spontaneous breathing, nonlabored ventilation, respiratory function stable and patient connected to nasal cannula oxygen Cardiovascular status: blood pressure returned to baseline and stable Postop Assessment: no apparent nausea or vomiting Anesthetic complications: no   No notable events documented.  Last Vitals:  Vitals:   06/11/23 0950 06/11/23 1000  BP: 136/89 (!) 152/90  Pulse: 67 66  Resp: 13 16  Temp:  (!) 36.2 C  SpO2: 98% 98%    Last Pain:  Vitals:   06/11/23 1000  TempSrc:   PainSc: 0-No pain                 Corneisha Alvi

## 2023-06-11 NOTE — H&P (Signed)
58 yof with no prior breast history and no mass/dc. Had her screening mm. This shows c density tissue. There were possible bilateral breast masses noted. She then had dx views. The left side was thought to be consistent with a mole. The right side has an 8x4x5 mm mass present. No LAD on right. Biopsy was done that shows a benign FA with comment discussing highly cellular stroma and UDH with a focal micropapilloma. She has no complaints and is here to discuss options.   Review of Systems: A complete review of systems was obtained from the patient. I have reviewed this information and discussed as appropriate with the patient. See HPI as well for other ROS.  Review of Systems  HENT: Positive for congestion.  Cardiovascular: Positive for palpitations and leg swelling.  All other systems reviewed and are negative.   Medical History: Past Medical History:  Diagnosis Date  Arrhythmia  Hypertension  Thyroid disease    Past Surgical History:  Procedure Laterality Date  CATARACT EXTRACTION  Heart Ablation    Allergies  Allergen Reactions  Doxycycline Other (See Comments), Hives, Itching and Rash  Other reaction(s): Confusion (intolerance)  Iohexol Other (See Comments) and Cough  Lips tingling, difficulty swallowing, dry cough . Patient will need pre-meds before administration of contrast Heart race that night. Lips tingling, difficulty swallowing, dry cough . Patient will need pre-meds before administration of contrast  Heart race that night.  Metoprolol Hives and Other (See Comments)  Extreme fatigue  Tetanus And Diphtheria Toxoids Other (See Comments)  Caused pt to go into afib   Current Outpatient Medications on File Prior to Visit  Medication Sig Dispense Refill  ascorbic acid, vitamin C, (VITAMIN C) 1000 MG tablet 1 tablet  biotin 1 mg Cap Take by mouth  calcium-vitamin D3-vitamin K 500 mg-1,000 unit-40 mcg Chew SEE ADMIN INSTRUCTIONS  cholecalciferol (VITAMIN D3) 1000 unit  capsule Take by mouth  glucosamine sulfate 500 mg Tab Take 1 tablet by mouth once daily  Lactobacillus acidophilus 10 billion cell Cap Take by mouth  losartan (COZAAR) 25 MG tablet Take 25 mg by mouth once daily  omega-3 fatty acids/fish oil (FISH OIL) 340-1,000 mg capsule 1 capsule  triamcinolone 0.1 % cream 1 application  VITAMIN B COMPLEX ORAL Take 1 capsule by mouth once daily  XARELTO 20 mg tablet Take by mouth    Family History  Problem Relation Age of Onset  Coronary Artery Disease (Blocked arteries around heart) Mother  High blood pressure (Hypertension) Father    Social History   Tobacco Use  Smoking Status Former  Types: Cigarettes  Smokeless Tobacco Never  Marital status: Divorced  Tobacco Use  Smoking status: Former  Types: Cigarettes  Smokeless tobacco: Never  Vaping Use  Vaping status: Never Used  Substance and Sexual Activity  Alcohol use: Not Currently  Drug use: Never   Objective:   Vitals:  05/25/23 0937 05/25/23 0942  BP: 122/82  Pulse: 53  Temp: 36.7 C (98 F)  SpO2: 98%  Weight: 78.2 kg (172 lb 4.8 oz)  Height: 162.6 cm (5\' 4" )  PainSc: 0-No pain   Body mass index is 29.58 kg/m.  Physical Exam Vitals reviewed.  Constitutional:  Appearance: Normal appearance.  Chest:  Breasts: Right: No inverted nipple, mass or nipple discharge.  Left: No inverted nipple, mass or nipple discharge.  Lymphadenopathy:  Upper Body:  Right upper body: No supraclavicular or axillary adenopathy.  Left upper body: No supraclavicular or axillary adenopathy.  Neurological:  Mental  Status: She is alert.   Assessment and Plan:   Subareolar mass of right breast  Right breast seed guided excisional biopsy  Discussed option of observation vs excision. At 53 with path report I think most reasonable to excise and she agrees. Will proceed. Discussed surgery, risks and recovery with her today. Will get cards clearance due to afib and she is on xarelto.

## 2023-06-11 NOTE — Op Note (Signed)
  Preoperative diagnosis: Right breast mammographic mass  postoperative diagnosis: Same as above Procedure: Right breast radioactive seed guided excisional biopsy Surgeon: Dr Harden Mo Anesthesia: General Estimated blood loss: Minimal Complications: None Drains: None Specimens: Right breast tissue containing seed and clip marked. Sponge and count was correct completion Disposition recovery stable condition   Indications: 79 yof with no prior breast history and no mass/dc. Had her screening mm. This shows c density tissue. There were possible bilateral breast masses noted. She then had dx views. The left side was thought to be consistent with a mole. The right side has an 8x4x5 mm mass present. No LAD on right. Biopsy was done that shows a benign FA with comment discussing highly cellular stroma and UDH with a focal micropapilloma.we discussed excision.    Procedure: After informed consent was obtained she was taken to the operating room.  She was given antibiotics.  SCDs were placed.  She was placed under general anesthesia without complication.  She was prepped and draped in the standard sterile surgical fashion.  A surgical timeout was then performed.   I located the seed in the central breast.  I then made a periareolar incision in order to hide the scar later.  infiltrated this whole area with Marcaine.  I then dissected the seed.  The seed and some of the surrounding tissue were removed.  Mammogram confirmed removal of the seed and the clip.  I then obtained hemostasis.  I closed the breast tissue with 2-0 Vicryl.  The skin was closed with 3-0 Vicryl and 5-0 Monocryl.  Glue and Steri-Strips were applied.  She tolerated this well was extubated and transferred recovery stable.

## 2023-06-11 NOTE — Discharge Instructions (Addendum)
[facility] Office Phone Number [phone]  POST OP INSTRUCTIONS Take 400 mg of ibuprofen every 8 hours or 650 mg tylenol every 6 hours for next 72 hours then as needed. Use ice several times daily also.  A prescription for pain medication may be given to you upon discharge.  Take your pain medication as prescribed, if needed.  If narcotic pain medicine is not needed, then you may take acetaminophen (Tylenol), naprosyn (Alleve) or ibuprofen (Advil) as needed. Take your usually prescribed medications unless otherwise directed If you need a refill on your pain medication, please contact your pharmacy.  They will contact our office to request authorization.  Prescriptions will not be filled after 5pm or on week-ends. You should eat very light the first 24 hours after surgery, such as soup, crackers, pudding, etc.  Resume your normal diet the day after surgery. Most patients will experience some swelling and bruising in the breast.  Ice packs and a good support bra will help.  Wear the breast binder provided or a sports bra for 72 hours day and night.  After that wear a sports bra during the day until you return to the office. Swelling and bruising can take several days to resolve.  It is common to experience some constipation if taking pain medication after surgery.  Increasing fluid intake and taking a stool softener will usually help or prevent this problem from occurring.  A mild laxative (Milk of Magnesia or Miralax) should be taken according to package directions if there are no bowel movements after 48 hours. I used skin glue on the incision, you may shower in 24 hours.  The glue will flake off over the next 2-3 weeks.  Any sutures or staples will be removed at the office during your follow-up visit. ACTIVITIES:  You may resume regular daily activities (gradually increasing) beginning the next day.  Wearing a good support bra or sports bra minimizes pain and swelling.  You may have  sexual intercourse when it is comfortable. You may drive when you no longer are taking prescription pain medication, you can comfortably wear a seatbelt, and you can safely maneuver your car and apply brakes. RETURN TO WORK:  ______________________________________________________________________________________ Deborah Ortiz should see your doctor in the office for a follow-up appointment approximately two weeks after your surgery.  Your doctor's nurse will typically make your follow-up appointment when she calls you with your pathology report.  Expect your pathology report 3-4 business days after your surgery.  You may call to check if you do not hear from [place] after three days. OTHER INSTRUCTIONS: _______________________________________________________________________________________________ _____________________________________________________________________________________________________________________________________ _____________________________________________________________________________________________________________________________________ _____________________________________________________________________________________________________________________________________  WHEN TO CALL DR WAKEFIELD: Fever over 101.0 Nausea and/or vomiting. Extreme swelling or bruising. Continued bleeding from incision. Increased pain, redness, or drainage from the incision.  The clinic staff is available to answer your questions during regular business hours.  Please don't hesitate to call and ask to speak to one of the nurses for clinical concerns.  If you have a medical emergency, go to the nearest emergency room or call 911.  A surgeon from [facility] is always on call at the hospital.  For further questions, please visit [email or web address] May take Tylenol after 2pm, if needed.    Post Anesthesia Home Care Instructions  Activity: Get plenty of rest for the remainder of the  day. A responsible individual must stay with you for 24 hours following the procedure.  For the next 24 hours, DO NOT: -Drive a car Environmental consultant -Drink  alcoholic beverages -Take any medication unless instructed by your physician -Make any legal decisions or sign important papers.  Meals: Start with liquid foods such as gelatin or soup. Progress to regular foods as tolerated. Avoid greasy, spicy, heavy foods. If nausea and/or vomiting occur, drink only clear liquids until the nausea and/or vomiting subsides. Call your physician if vomiting continues.  Special Instructions/Symptoms: Your throat may feel dry or sore from the anesthesia or the breathing tube placed in your throat during surgery. If this causes discomfort, gargle with warm salt water. The discomfort should disappear within 24 hours.  If you had a scopolamine patch placed behind your ear for the management of post- operative nausea and/or vomiting:  1. The medication in the patch is effective for 72 hours, after which it should be removed.  Wrap patch in a tissue and discard in the trash. Wash hands thoroughly with soap and water. 2. You may remove the patch earlier than 72 hours if you experience unpleasant side effects which may include dry mouth, dizziness or visual disturbances. 3. Avoid touching the patch. Wash your hands with soap and water after contact with the patch.

## 2023-06-11 NOTE — Anesthesia Procedure Notes (Signed)
Procedure Name: LMA Insertion Date/Time: 06/11/2023 9:08 AM  Performed by: Blocker, Jewel Baize, CRNAPre-anesthesia Checklist: Patient identified, Emergency Drugs available, Suction available and Patient being monitored Patient Re-evaluated:Patient Re-evaluated prior to induction Oxygen Delivery Method: Circle system utilized Preoxygenation: Pre-oxygenation with 100% oxygen Induction Type: IV induction Ventilation: Mask ventilation without difficulty LMA: LMA inserted LMA Size: 4.0 Number of attempts: 1 Airway Equipment and Method: Bite block Placement Confirmation: positive ETCO2 Tube secured with: Tape Dental Injury: Teeth and Oropharynx as per pre-operative assessment

## 2023-06-12 ENCOUNTER — Encounter (HOSPITAL_BASED_OUTPATIENT_CLINIC_OR_DEPARTMENT_OTHER): Payer: Self-pay | Admitting: General Surgery

## 2023-06-12 LAB — SURGICAL PATHOLOGY

## 2023-06-28 ENCOUNTER — Ambulatory Visit: Payer: Medicare Other

## 2023-06-28 NOTE — Progress Notes (Signed)
Patient presents today to pick up custom molded foot orthotics, diagnosed with PF by Dr. Charlsie Merles.   Orthotics were dispensed and fit was satisfactory. Reviewed instructions for break-in and wear. Written instructions given to patient.  Patient will follow up as needed.   Addison Bailey Cped, CFo, CFm

## 2023-06-29 ENCOUNTER — Telehealth: Payer: Self-pay | Admitting: Podiatry

## 2023-06-29 NOTE — Telephone Encounter (Signed)
Patient called stating she picked up orthotics yesterday and they do not fit. Patient states she would like for you to call and talk to her about it [phone].

## 2023-07-05 ENCOUNTER — Other Ambulatory Visit: Payer: Self-pay

## 2023-07-05 DIAGNOSIS — I480 Paroxysmal atrial fibrillation: Secondary | ICD-10-CM

## 2023-07-05 MED ORDER — RIVAROXABAN 20 MG PO TABS: 20.0000 mg | ORAL_TABLET | Freq: Every day | ORAL | 5 refills | Status: DC

## 2023-07-05 NOTE — Telephone Encounter (Signed)
Prescription refill request for Xarelto received.  Indication: Afib  Last office visit: 05/08/23 ([place])  Weight: 79.6kg Age: 67 Scr: 0.74 (05/27/23)  CrCl: 90.67ml/min  Appropriate dose. Refill sent.

## 2023-07-12 ENCOUNTER — Ambulatory Visit: Payer: Medicare Other

## 2023-07-12 NOTE — Progress Notes (Signed)
Both orthotics had lifts and should have only been the Right I removed Left lift.  Patient will call if any other problems arise  Addison Bailey CPed, CFo, CFm

## 2023-09-12 ENCOUNTER — Ambulatory Visit (HOSPITAL_COMMUNITY): Payer: Self-pay

## 2023-09-19 ENCOUNTER — Ambulatory Visit
Admission: RE | Admit: 2023-09-19 | Discharge: 2023-09-19 | Disposition: A | Discharge status: Home / Self Care | Payer: Medicare Other | Source: Ambulatory Visit | Attending: Internal Medicine | Admitting: Internal Medicine

## 2023-09-19 DIAGNOSIS — M8580 Other specified disorders of bone density and structure, unspecified site: Secondary | ICD-10-CM

## 2023-11-08 ENCOUNTER — Encounter (HOSPITAL_COMMUNITY): Payer: Self-pay | Admitting: Physician Assistant

## 2023-11-08 ENCOUNTER — Ambulatory Visit (HOSPITAL_COMMUNITY)
Admission: RE | Admit: 2023-11-08 | Discharge: 2023-11-08 | Disposition: A | Discharge status: Home / Self Care | Payer: Medicare Other | Source: Ambulatory Visit | Attending: Physician Assistant | Admitting: Physician Assistant

## 2023-11-08 VITALS — BP 118/80 | HR 82 | Ht 64.0 in | Wt 171.6 lb

## 2023-11-08 DIAGNOSIS — Z7901 Long term (current) use of anticoagulants: Secondary | ICD-10-CM | POA: Insufficient documentation

## 2023-11-08 DIAGNOSIS — I480 Paroxysmal atrial fibrillation: Secondary | ICD-10-CM | POA: Diagnosis present

## 2023-11-08 DIAGNOSIS — D6869 Other thrombophilia: Secondary | ICD-10-CM | POA: Diagnosis not present

## 2023-11-08 DIAGNOSIS — I4892 Unspecified atrial flutter: Secondary | ICD-10-CM | POA: Insufficient documentation

## 2023-11-08 DIAGNOSIS — I10 Essential (primary) hypertension: Secondary | ICD-10-CM | POA: Diagnosis not present

## 2023-11-08 DIAGNOSIS — J984 Other disorders of lung: Secondary | ICD-10-CM | POA: Diagnosis not present

## 2023-11-08 NOTE — Progress Notes (Signed)
 Primary Care Physician: Ransom Other, MD Primary Electrophysiologist: Dr Inocencio Referring Physician: Dr Inocencio Ortiz Deborah Deborah is a 68 y.o. female with a history of MR, PVCs, atrial fibrillation who presents for follow up in the [facility].  The patient was initially diagnosed with atrial fibrillation 2018 and had a cryoablation in 2019. Patient is on Xarelto  for a CHADS2VASC score of 2. She was seen by Dr Inocencio 03/21/22 and was found to be in atrial flutter. She was started on diltiazem  and scheduled for ablation. In the interim, she was started on amiodarone  as a bridge to ablation.   Patient is s/p afib and flutter ablation with Dr Inocencio on 07/21/22. Her amiodarone  and diltiazem  were discontinued 10/19/22.  Patient returns for follow up for atrial fibrillation. She reports that she has done well since her last visit. She has palpitations that are rare and very brief. No bleeding issues on anticoagulation. She did test positive for influenza A two weeks ago.   Today, he denies symptoms of chest pain, shortness of breath, orthopnea, PND, lower extremity edema, dizziness, presyncope, syncope, snoring, daytime somnolence, bleeding, or neurologic sequela. The patient is tolerating medications without difficulties and is otherwise without complaint today.    Atrial Fibrillation Risk Factors:  she does not have symptoms or diagnosis of sleep apnea. she does not have a history of rheumatic fever. she does not have a history of alcohol use. The patient does not have a history of early familial atrial fibrillation or other arrhythmias.   Atrial Fibrillation Management history:  Previous antiarrhythmic drugs: sotalol, amiodarone   Previous cardioversions: 2019 Previous ablations: 2019 cyroablation, 07/21/22 Anticoagulation history: Xarelto     Past Medical History:  Diagnosis Date   Cataract    Hypertension    Long term (current) use of  anticoagulants    Mitral valve regurgitation    Palpitations    Paroxysmal atrial fibrillation (HCC)    Pneumonia    HISTORY   Sinusitis, acute     Current Outpatient Medications  Medication Sig Dispense Refill   Ascorbic Acid (VITAMIN C PO) Take 1 tablet by mouth daily.     B Complex Vitamins (VITAMIN B COMPLEX PO) Take by mouth.     BIOTIN PO Take 1 tablet by mouth daily.     Calcium-Vitamin D -Vitamin K (CALCIUM + D) [phone] MG-UNT-MCG CHEW See admin instructions.     Carboxymethylcellul-Glycerin (REFRESH OPTIVE OP) Place 1-2 vials into both eyes daily.     Cholecalciferol  (VITAMIN D -3 PO) Take 1 capsule by mouth daily.     COLLAGEN PO Take 1 tablet by mouth daily.     diltiazem  (CARDIZEM ) 30 MG tablet Take 1 tablet every 4 hours AS NEEDED for AFIB heart rate >90 as long as top BP >100. 30 tablet 1   Glucosamine HCl (GLUCOSAMINE PO) Take 1,000 mg by mouth daily. Shell fish free     levalbuterol (XOPENEX HFA) 45 MCG/ACT inhaler Inhale 1 puff into the lungs every 6 (six) hours as needed for wheezing or shortness of breath.     losartan (COZAAR) 25 MG tablet Take 25 mg by mouth daily.     Multiple Vitamin (MULTIVITAMIN) capsule Take 1 capsule by mouth daily.     Omega-3 Fatty Acids (FISH OIL PO) Take 1 capsule by mouth daily.      Probiotic Product (PROBIOTIC PO) Take 1 tablet by mouth daily.     rivaroxaban  (XARELTO ) 20 MG TABS tablet Take 1 tablet (20  mg total) by mouth daily with supper. 30 tablet 5   triamcinolone  cream (KENALOG ) 0.5 % Apply 1 Application topically as needed.     No current facility-administered medications for this encounter.    ROS- All systems are reviewed and negative except as per the HPI above.  Physical Exam: Vitals:   11/08/23 1018  BP: 118/80  Pulse: 82  Weight: 77.8 kg  Height: 5' 4 (1.626 m)     GEN: Well nourished, well developed in no acute distress CARDIAC: Regular rate and rhythm, no murmurs, rubs, gallops RESPIRATORY:  Clear to  auscultation without rales, wheezing or rhonchi  ABDOMEN: Soft, non-tender, non-distended EXTREMITIES:  No edema; No deformity    Wt Readings from Last 3 Encounters:  11/08/23 77.8 kg  06/11/23 79.6 kg  05/08/23 75.7 kg    EKG today demonstrates  SR Vent. rate 82 BPM PR interval 186 ms QRS duration 68 ms QT/QTcB 384/448 ms   Echo 09/26/20 demonstrated   1. Left ventricular ejection fraction, by estimation, is 65 to 70%. The  left ventricle has normal function. The left ventricle has no regional  wall motion abnormalities. Left ventricular diastolic parameters were  normal.   2. Right ventricular systolic function is normal. The right ventricular  size is normal. There is normal pulmonary artery systolic pressure.   3. The mitral valve is normal in structure. Mild mitral valve  regurgitation. No evidence of mitral stenosis.   4. The aortic valve is normal in structure. Aortic valve regurgitation is  not visualized. No aortic stenosis is present.   5. The inferior vena cava is normal in size with greater than 50%  respiratory variability, suggesting right atrial pressure of 3 mmHg.   Epic records are reviewed at length today  CHA2DS2-VASc Score = 3  The patient's score is based upon: CHF History: 0 HTN History: 1 Diabetes History: 0 Stroke History: 0 Vascular Disease History: 0 Age Score: 1 Gender Score: 1       ASSESSMENT AND PLAN: Paroxysmal Atrial Fibrillation/atrial flutter The patient's CHA2DS2-VASc score is 3, indicating a 3.2% annual risk of stroke.   S/p cryoablation 2019 and RF ablation for fib and flutter 07/21/22 Patient appears to be maintaining SR Continue Xarelto  20 mg daily  Secondary Hypercoagulable State (ICD10:  D68.69) The patient is at significant risk for stroke/thromboembolism based upon her CHA2DS2-VASc Score of 3.  Continue Rivaroxaban  (Xarelto ).   HTN Stable on current regimen   Follow up in the AF clinic in 6 months.    Daril Kicks PA-C [facility] [address] [place], [place] [address] [phone] 11/08/2023 10:30 AM

## 2024-03-19 ENCOUNTER — Other Ambulatory Visit: Payer: Self-pay | Admitting: Cardiology

## 2024-03-19 DIAGNOSIS — I480 Paroxysmal atrial fibrillation: Secondary | ICD-10-CM

## 2024-03-20 NOTE — Telephone Encounter (Signed)
 Prescription refill request for Xarelto  received.  Indication: afib  Last office visit: [place], 11/08/2023 Weight: 77.8 kg  Age: 68 yo  Scr: 0.74, 05/27/2023 CrCl: 87 ml/min   Refill sent.

## 2024-03-26 ENCOUNTER — Ambulatory Visit (HOSPITAL_COMMUNITY)
Admission: RE | Admit: 2024-03-26 | Discharge: 2024-03-26 | Disposition: A | Discharge status: Home / Self Care | Source: Ambulatory Visit | Attending: Physician Assistant | Admitting: Physician Assistant

## 2024-03-26 ENCOUNTER — Encounter (HOSPITAL_COMMUNITY): Payer: Self-pay | Admitting: Physician Assistant

## 2024-03-26 VITALS — BP 134/98 | HR 91 | Ht 64.0 in | Wt 172.2 lb

## 2024-03-26 DIAGNOSIS — D6869 Other thrombophilia: Secondary | ICD-10-CM | POA: Diagnosis not present

## 2024-03-26 DIAGNOSIS — I480 Paroxysmal atrial fibrillation: Secondary | ICD-10-CM | POA: Diagnosis not present

## 2024-03-26 MED ORDER — DILTIAZEM HCL ER COATED BEADS 120 MG PO CP24: 120.0000 mg | ORAL_CAPSULE | Freq: Every day | ORAL | 6 refills | Status: AC

## 2024-03-26 MED ORDER — DILTIAZEM HCL 30 MG PO TABS: ORAL_TABLET | ORAL | 1 refills | Status: AC

## 2024-03-26 NOTE — Patient Instructions (Signed)
 Start 120 mg diltiazem  daily

## 2024-03-26 NOTE — Progress Notes (Signed)
 Primary Care Physician: Ransom Other, MD Primary Electrophysiologist: Dr Inocencio Referring Physician: Dr Inocencio Deborah Jama Ortiz is a 68 y.o. female with a history of MR, PVCs, atrial fibrillation who presents for follow up in the [facility].  The patient was initially diagnosed with atrial fibrillation 2018 and had a cryoablation in 2019. Patient is on Xarelto  for stroke prevention. She was seen by Dr Inocencio 03/21/22 and was found to be in atrial flutter. She was started on diltiazem  and scheduled for ablation. In the interim, she was started on amiodarone  as a bridge to ablation.   Patient is s/p afib and flutter ablation with Dr Inocencio on 07/21/22. Her amiodarone  and diltiazem  were discontinued 10/19/22.  Patient returns for follow up for atrial fibrillation. Patient reports that for the past 2-3 weeks she has noticed increased afib episodes on her watch associated with fatigue and chest tightness. There were no specific triggers that she could identify. Her symptoms have improved over the past 3-4 days. No bleeding issues on anticoagulation. She has not used any PRN diltiazem .   Today, she  denies symptoms of shortness of breath, orthopnea, PND, dizziness, presyncope, syncope, snoring, daytime somnolence, bleeding, or neurologic sequela. The patient is tolerating medications without difficulties and is otherwise without complaint today.    Atrial Fibrillation Risk Factors:  she does not have symptoms or diagnosis of sleep apnea. she does not have a history of rheumatic fever. she does not have a history of alcohol use. The patient does not have a history of early familial atrial fibrillation or other arrhythmias.   Atrial Fibrillation Management history:  Previous antiarrhythmic drugs: sotalol, amiodarone   Previous cardioversions: 2019 Previous ablations: 2019 cyroablation, 07/21/22 Anticoagulation history: Xarelto     Past Medical History:   Diagnosis Date   Cataract    Hypertension    Long term (current) use of anticoagulants    Mitral valve regurgitation    Palpitations    Paroxysmal atrial fibrillation (HCC)    Pneumonia    HISTORY   Sinusitis, acute     Current Outpatient Medications  Medication Sig Dispense Refill   Ascorbic Acid (VITAMIN C PO) Take 1 tablet by mouth daily.     B Complex Vitamins (VITAMIN B COMPLEX PO) Take by mouth.     BIOTIN PO Take 1 tablet by mouth daily.     Calcium-Vitamin D -Vitamin K (CALCIUM + D) [phone] MG-UNT-MCG CHEW See admin instructions.     Carboxymethylcellul-Glycerin (REFRESH OPTIVE OP) Place 1-2 vials into both eyes daily.     Cholecalciferol  (VITAMIN D -3 PO) Take 1 capsule by mouth daily.     COLLAGEN PO Take 1 tablet by mouth daily.     diltiazem  (CARDIZEM  CD) 120 MG 24 hr capsule Take 1 capsule (120 mg total) by mouth daily. 30 capsule 6   Glucosamine HCl (GLUCOSAMINE PO) Take 1,000 mg by mouth daily. Shell fish free     levalbuterol (XOPENEX HFA) 45 MCG/ACT inhaler Inhale 1 puff into the lungs every 6 (six) hours as needed for wheezing or shortness of breath.     losartan (COZAAR) 25 MG tablet Take 25 mg by mouth daily.     Multiple Vitamin (MULTIVITAMIN) capsule Take 1 capsule by mouth daily.     Omega-3 Fatty Acids (FISH OIL PO) Take 1 capsule by mouth daily.      Probiotic Product (PROBIOTIC PO) Take 1 tablet by mouth daily.     rivaroxaban  (XARELTO ) 20 MG TABS tablet  TAKE 1 TABLET BY MOUTH DAILY WITH SUPPER 30 tablet 5   triamcinolone  cream (KENALOG ) 0.5 % Apply 1 Application topically as needed.     diltiazem  (CARDIZEM ) 30 MG tablet Take 1 tablet every 4 hours AS NEEDED for AFIB heart rate >90 as long as top BP >100. 30 tablet 1   No current facility-administered medications for this encounter.    ROS- All systems are reviewed and negative except as per the HPI above.  Physical Exam: Vitals:   03/26/24 0855  BP: (!) 134/98  Pulse: 91  Weight: 78.1 kg   Height: 5' 4 (1.626 m)    GEN: Well nourished, well developed in no acute distress CARDIAC: Regular rate and rhythm, no murmurs, rubs, gallops RESPIRATORY:  Clear to auscultation without rales, wheezing or rhonchi  ABDOMEN: Soft, non-tender, non-distended EXTREMITIES:  non pitting edema bilateral, No deformity    Wt Readings from Last 3 Encounters:  03/26/24 78.1 kg  11/08/23 77.8 kg  06/11/23 79.6 kg    EKG today demonstrates  SR Vent. rate 91 BPM PR interval 180 ms QRS duration 68 ms QT/QTcB 352/432 ms   Echo 09/26/20 demonstrated   1. Left ventricular ejection fraction, by estimation, is 65 to 70%. The  left ventricle has normal function. The left ventricle has no regional  wall motion abnormalities. Left ventricular diastolic parameters were  normal.   2. Right ventricular systolic function is normal. The right ventricular  size is normal. There is normal pulmonary artery systolic pressure.   3. The mitral valve is normal in structure. Mild mitral valve  regurgitation. No evidence of mitral stenosis.   4. The aortic valve is normal in structure. Aortic valve regurgitation is  not visualized. No aortic stenosis is present.   5. The inferior vena cava is normal in size with greater than 50%  respiratory variability, suggesting right atrial pressure of 3 mmHg.   Epic records are reviewed at length today  CHA2DS2-VASc Score = 3  The patient's score is based upon: CHF History: 0 HTN History: 1 Diabetes History: 0 Stroke History: 0 Vascular Disease History: 0 Age Score: 1 Gender Score: 1       ASSESSMENT AND PLAN: Paroxysmal Atrial Fibrillation/atrial flutter The patient's CHA2DS2-VASc score is 3, indicating a 3.2% annual risk of stroke.   S/p cryoablation 2019 and RF ablation for afib and flutter 07/21/22 Patient in SR today. Has had more episodes in the past few weeks. We discussed rhythm control options including resuming diltiazem  120 mg daily. She would  like to continue to monitor for now but if she has quick return of her afib she will start daily diltiazem . Continue diltiazem  30 mg PRN q 4 hours for heart racing.  Continue Xarelto  20 mg daily  Secondary Hypercoagulable State (ICD10:  D68.69) The patient is at significant risk for stroke/thromboembolism based upon her CHA2DS2-VASc Score of 3.  Continue Rivaroxaban  (Xarelto ). No bleeding issues.   HTN Mildly elevated today, well controlled at last visit. No changes today, continue to monitor.    Follow up in the AF clinic in 3 months.    Daril Kicks PA-C Af[facility] Clinic [facility] [address] [place], [place] [address] [phone] 03/26/2024 9:27 AM

## 2024-05-08 ENCOUNTER — Ambulatory Visit (HOSPITAL_COMMUNITY): Payer: BLUE CROSS/BLUE SHIELD | Admitting: Physician Assistant

## 2024-05-09 ENCOUNTER — Ambulatory Visit (HOSPITAL_COMMUNITY): Admitting: Physician Assistant

## 2024-05-16 ENCOUNTER — Other Ambulatory Visit: Payer: Self-pay | Admitting: Internal Medicine

## 2024-05-16 DIAGNOSIS — R911 Solitary pulmonary nodule: Secondary | ICD-10-CM

## 2024-05-21 ENCOUNTER — Ambulatory Visit
Admission: RE | Admit: 2024-05-21 | Discharge: 2024-05-21 | Disposition: A | Discharge status: Home / Self Care | Source: Ambulatory Visit | Attending: Internal Medicine | Admitting: Internal Medicine

## 2024-05-21 DIAGNOSIS — R911 Solitary pulmonary nodule: Secondary | ICD-10-CM

## 2024-06-26 ENCOUNTER — Ambulatory Visit (HOSPITAL_COMMUNITY)
Admission: RE | Admit: 2024-06-26 | Discharge: 2024-06-26 | Disposition: A | Discharge status: Home / Self Care | Source: Ambulatory Visit | Attending: Physician Assistant | Admitting: Physician Assistant

## 2024-06-26 VITALS — BP 132/86 | HR 81 | Ht 64.0 in | Wt 174.0 lb

## 2024-06-26 DIAGNOSIS — I4891 Unspecified atrial fibrillation: Secondary | ICD-10-CM

## 2024-06-26 DIAGNOSIS — D6869 Other thrombophilia: Secondary | ICD-10-CM

## 2024-06-26 DIAGNOSIS — I480 Paroxysmal atrial fibrillation: Secondary | ICD-10-CM

## 2024-06-26 NOTE — Progress Notes (Signed)
 Primary Care Physician: Ransom Other, MD Primary Electrophysiologist: Dr Inocencio Referring Physician: Dr Inocencio Deborah Ortiz is a 68 y.o. female with a history of MR, PVCs, atrial fibrillation who presents for follow up in the [facility].  The patient was initially diagnosed with atrial fibrillation 2018 and had a cryoablation in 2019. Patient is on Xarelto  for stroke prevention. She was seen by Dr Inocencio 03/21/22 and was found to be in atrial flutter. She was started on diltiazem  and scheduled for ablation. In the interim, she was started on amiodarone  as a bridge to ablation.   Patient is s/p afib and flutter ablation with Dr Inocencio on 07/21/22. Her amiodarone  and diltiazem  were discontinued 10/19/22.  Patient returns for follow up for atrial fibrillation. She reports that she had done well since her last visit. She had two brief episodes of tachypalpitations but did not have to take any diltiazem . She has been more active recently. No bleeding issues on anticoagulation.   Today, she  denies symptoms of chest pain, shortness of breath, orthopnea, PND, lower extremity edema, dizziness, presyncope, syncope, snoring, daytime somnolence, bleeding, or neurologic sequela. The patient is tolerating medications without difficulties and is otherwise without complaint today.    Atrial Fibrillation Risk Factors:  she does not have symptoms or diagnosis of sleep apnea. she does not have a history of rheumatic fever. she does not have a history of alcohol use. The patient does not have a history of early familial atrial fibrillation or other arrhythmias.   Atrial Fibrillation Management history:  Previous antiarrhythmic drugs: sotalol, amiodarone   Previous cardioversions: 2019 Previous ablations: 2019 cyroablation, 07/21/22 Anticoagulation history: Xarelto     Past Medical History:  Diagnosis Date   Cataract    Hypertension    Long term (current)  use of anticoagulants    Mitral valve regurgitation    Palpitations    Paroxysmal atrial fibrillation (HCC)    Pneumonia    HISTORY   Sinusitis, acute     Current Outpatient Medications  Medication Sig Dispense Refill   Ascorbic Acid (VITAMIN C PO) Take 1 tablet by mouth daily.     B Complex Vitamins (VITAMIN B COMPLEX PO) Take by mouth. (Patient taking differently: Take 1 tablet by mouth every morning.)     BIOTIN PO Take 1 tablet by mouth daily.     Calcium-Vitamin D -Vitamin K (CALCIUM + D) [phone] MG-UNT-MCG CHEW See admin instructions. (Patient taking differently: Chew 1 tablet by mouth every other day.)     Carboxymethylcellul-Glycerin (REFRESH OPTIVE OP) Place 1-2 vials into both eyes daily.     Cholecalciferol  (VITAMIN D -3 PO) Take 1 capsule by mouth daily.     COLLAGEN PO Take 1 tablet by mouth daily.     diltiazem  (CARDIZEM ) 30 MG tablet Take 1 tablet every 4 hours AS NEEDED for AFIB heart rate >90 as long as top BP >100. 30 tablet 1   Glucosamine HCl (GLUCOSAMINE PO) Take 1,000 mg by mouth daily. Shell fish free     levalbuterol (XOPENEX HFA) 45 MCG/ACT inhaler Inhale 1 puff into the lungs every 6 (six) hours as needed for wheezing or shortness of breath.     losartan (COZAAR) 25 MG tablet Take 25 mg by mouth daily.     Misc Natural Products (BEET ROOT) 500 MG CAPS Beet chewables- Taking 2 daily     Multiple Vitamin (MULTIVITAMIN) capsule Take 1 capsule by mouth daily.     Omega-3 Fatty Acids (  FISH OIL PO) Take 1 capsule by mouth daily.      Probiotic Product (PROBIOTIC PO) Take 1 tablet by mouth daily.     rivaroxaban  (XARELTO ) 20 MG TABS tablet TAKE 1 TABLET BY MOUTH DAILY WITH SUPPER 30 tablet 5   triamcinolone  cream (KENALOG ) 0.5 % Apply 1 Application topically as needed.     diltiazem  (CARDIZEM  CD) 120 MG 24 hr capsule Take 1 capsule (120 mg total) by mouth daily. (Patient not taking: Reported on 06/26/2024) 30 capsule 6   No current facility-administered medications  for this encounter.    ROS- All systems are reviewed and negative except as per the HPI above.  Physical Exam: Vitals:   06/26/24 0928  Pulse: 81  Weight: 78.9 kg  Height: 5' 4 (1.626 m)     GEN: Well nourished, well developed in no acute distress CARDIAC: Regular rate and rhythm, no murmurs, rubs, gallops RESPIRATORY:  Clear to auscultation without rales, wheezing or rhonchi  ABDOMEN: Soft, non-tender, non-distended EXTREMITIES:  No edema; No deformity    Wt Readings from Last 3 Encounters:  06/26/24 78.9 kg  03/26/24 78.1 kg  11/08/23 77.8 kg    EKG today demonstrates  SR Vent. rate 81 BPM PR interval 180 ms QRS duration 70 ms QT/QTcB 380/441 ms   Echo 09/26/20 demonstrated   1. Left ventricular ejection fraction, by estimation, is 65 to 70%. The  left ventricle has normal function. The left ventricle has no regional  wall motion abnormalities. Left ventricular diastolic parameters were  normal.   2. Right ventricular systolic function is normal. The right ventricular  size is normal. There is normal pulmonary artery systolic pressure.   3. The mitral valve is normal in structure. Mild mitral valve  regurgitation. No evidence of mitral stenosis.   4. The aortic valve is normal in structure. Aortic valve regurgitation is  not visualized. No aortic stenosis is present.   5. The inferior vena cava is normal in size with greater than 50%  respiratory variability, suggesting right atrial pressure of 3 mmHg.   Epic records are reviewed at length today   CHA2DS2-VASc Score = 3  The patient's score is based upon: CHF History: 0 HTN History: 1 Diabetes History: 0 Stroke History: 0 Vascular Disease History: 0 Age Score: 1 Gender Score: 1       ASSESSMENT AND PLAN: Paroxysmal Atrial Fibrillation/atrial flutter (ICD10:  I48.0) The patient's CHA2DS2-VASc score is 3, indicating a 3.2% annual risk of stroke.   S/p cryoablation 2019 and RF ablation for afib and  flutter 07/21/22 Patient appears to be maintaining SR.  Continue Xarelto  20 mg daily Continue diltiazem  30 mg PRN q 4 hours for heart racing.   Secondary Hypercoagulable State (ICD10:  D68.69) The patient is at significant risk for stroke/thromboembolism based upon her CHA2DS2-VASc Score of 3.  Continue Rivaroxaban  (Xarelto ). No bleeding issues.   HTN Stable on current regimen   Follow up with Dr Inocencio in 6 months. AF clinic in one year.    Daril Kicks PA-C [facility] [address] [place], [place] [address] [phone] 06/26/2024 9:47 AM

## 2024-07-22 ENCOUNTER — Other Ambulatory Visit: Payer: Self-pay | Admitting: Obstetrics and Gynecology

## 2024-07-22 DIAGNOSIS — Z1231 Encounter for screening mammogram for malignant neoplasm of breast: Secondary | ICD-10-CM

## 2024-08-19 ENCOUNTER — Ambulatory Visit
Admission: RE | Admit: 2024-08-19 | Discharge: 2024-08-19 | Disposition: A | Source: Ambulatory Visit | Attending: Obstetrics and Gynecology | Admitting: Obstetrics and Gynecology

## 2024-08-19 DIAGNOSIS — Z1231 Encounter for screening mammogram for malignant neoplasm of breast: Secondary | ICD-10-CM

## 2024-09-19 ENCOUNTER — Encounter: Payer: Self-pay | Admitting: Cardiology

## 2024-09-22 ENCOUNTER — Other Ambulatory Visit: Payer: Self-pay | Admitting: *Deleted

## 2024-09-22 DIAGNOSIS — I480 Paroxysmal atrial fibrillation: Secondary | ICD-10-CM

## 2024-09-22 MED ORDER — RIVAROXABAN 20 MG PO TABS: 20.0000 mg | ORAL_TABLET | Freq: Every day | ORAL | 5 refills | Status: AC

## 2024-09-22 NOTE — Telephone Encounter (Signed)
 Pt called in stating about refill

## 2024-09-22 NOTE — Telephone Encounter (Signed)
 Prescription refill request for Xarelto  received.  Indication: PAF Last office visit: 06/26/24  C Fenton PA Weight: 78.9kg Age: 68 Scr: 0.74 on 05/27/23  Epic CrCl:  88.11  Based on above findings Xarelto  20mg  daily is the appropriate dose.  Refill approved.

## 2024-12-23 ENCOUNTER — Ambulatory Visit: Admitting: Physician Assistant

## 2025-03-19 ENCOUNTER — Ambulatory Visit: Admitting: Cardiology
